# Patient Record
Sex: Female | Born: 1950 | Race: Black or African American | Hispanic: No | Marital: Married | Smoking: Never smoker
Health system: Southern US, Community
[De-identification: ages and names within clinical notes are randomized; demographics above are authoritative.]

## PROBLEM LIST (undated history)

## (undated) DIAGNOSIS — Z86010 Personal history of colonic polyps: Secondary | ICD-10-CM

## (undated) DIAGNOSIS — K219 Gastro-esophageal reflux disease without esophagitis: Secondary | ICD-10-CM

## (undated) DIAGNOSIS — E669 Obesity, unspecified: Secondary | ICD-10-CM

## (undated) DIAGNOSIS — E119 Type 2 diabetes mellitus without complications: Secondary | ICD-10-CM

## (undated) DIAGNOSIS — K922 Gastrointestinal hemorrhage, unspecified: Secondary | ICD-10-CM

## (undated) DIAGNOSIS — E785 Hyperlipidemia, unspecified: Secondary | ICD-10-CM

## (undated) DIAGNOSIS — I639 Cerebral infarction, unspecified: Secondary | ICD-10-CM

## (undated) DIAGNOSIS — I10 Essential (primary) hypertension: Secondary | ICD-10-CM

## (undated) HISTORY — DX: Hyperlipidemia, unspecified: E78.5

## (undated) HISTORY — DX: Personal history of colonic polyps: Z86.010

## (undated) HISTORY — PX: COLONOSCOPY: SHX174

## (undated) HISTORY — DX: Type 2 diabetes mellitus without complications: E11.9

## (undated) HISTORY — DX: Gastro-esophageal reflux disease without esophagitis: K21.9

## (undated) HISTORY — DX: Obesity, unspecified: E66.9

---

## 2005-05-30 DIAGNOSIS — I639 Cerebral infarction, unspecified: Secondary | ICD-10-CM

## 2005-05-30 HISTORY — DX: Cerebral infarction, unspecified: I63.9

## 2013-07-12 ENCOUNTER — Encounter (HOSPITAL_COMMUNITY): Payer: Self-pay | Admitting: Emergency Medicine

## 2013-07-12 ENCOUNTER — Emergency Department (HOSPITAL_COMMUNITY)
Admission: EM | Admit: 2013-07-12 | Discharge: 2013-07-13 | Disposition: A | Discharge status: Home / Self Care | Payer: No Typology Code available for payment source | Attending: Emergency Medicine | Admitting: Emergency Medicine

## 2013-07-12 ENCOUNTER — Emergency Department (HOSPITAL_COMMUNITY): Payer: No Typology Code available for payment source

## 2013-07-12 DIAGNOSIS — R5383 Other fatigue: Secondary | ICD-10-CM

## 2013-07-12 DIAGNOSIS — R5381 Other malaise: Secondary | ICD-10-CM | POA: Insufficient documentation

## 2013-07-12 DIAGNOSIS — J189 Pneumonia, unspecified organism: Secondary | ICD-10-CM

## 2013-07-12 DIAGNOSIS — R51 Headache: Secondary | ICD-10-CM | POA: Insufficient documentation

## 2013-07-12 DIAGNOSIS — I10 Essential (primary) hypertension: Secondary | ICD-10-CM | POA: Insufficient documentation

## 2013-07-12 DIAGNOSIS — J159 Unspecified bacterial pneumonia: Secondary | ICD-10-CM | POA: Insufficient documentation

## 2013-07-12 DIAGNOSIS — I69959 Hemiplegia and hemiparesis following unspecified cerebrovascular disease affecting unspecified side: Secondary | ICD-10-CM | POA: Insufficient documentation

## 2013-07-12 HISTORY — DX: Cerebral infarction, unspecified: I63.9

## 2013-07-12 HISTORY — DX: Essential (primary) hypertension: I10

## 2013-07-12 MED ORDER — ALBUTEROL SULFATE (2.5 MG/3ML) 0.083% IN NEBU
5.0000 mg | INHALATION_SOLUTION | Freq: Once | RESPIRATORY_TRACT | Status: AC
  Administered 2013-07-13: 5 mg via RESPIRATORY_TRACT
  Filled 2013-07-12: qty 6

## 2013-07-12 NOTE — ED Notes (Signed)
C/o sob and non-productive cough since yesterday.  Denies pain.

## 2013-07-13 LAB — BASIC METABOLIC PANEL
BUN: 11 mg/dL (ref 6–23)
CALCIUM: 9.4 mg/dL (ref 8.4–10.5)
CHLORIDE: 97 meq/L (ref 96–112)
CO2: 24 meq/L (ref 19–32)
Creatinine, Ser: 0.76 mg/dL (ref 0.50–1.10)
GFR calc Af Amer: >90 mL/min (ref >90)
GFR calc non Af Amer: 87 mL/min — ABNORMAL LOW (ref >90)
Glucose, Bld: 118 mg/dL — ABNORMAL HIGH (ref 70–99)
Potassium: 4 mEq/L (ref 3.7–5.3)
SODIUM: 136 meq/L — AB (ref 137–147)

## 2013-07-13 LAB — POCT I-STAT TROPONIN I: Troponin i, poc: 0.01 ng/mL (ref 0.00–0.08)

## 2013-07-13 LAB — CBC
HCT: 39 % (ref 36.0–46.0)
Hemoglobin: 12.7 g/dL (ref 12.0–15.0)
MCH: 28.3 pg (ref 26.0–34.0)
MCHC: 32.6 g/dL (ref 30.0–36.0)
MCV: 86.9 fL (ref 78.0–100.0)
PLATELETS: 243 10*3/uL (ref 150–400)
RBC: 4.49 MIL/uL (ref 3.87–5.11)
RDW: 13.9 % (ref 11.5–15.5)
WBC: 7.8 10*3/uL (ref 4.0–10.5)

## 2013-07-13 LAB — PRO B NATRIURETIC PEPTIDE: Pro B Natriuretic peptide (BNP): 15.1 pg/mL (ref 0–125)

## 2013-07-13 MED ORDER — KETOROLAC TROMETHAMINE 30 MG/ML IJ SOLN
30.0000 mg | Freq: Once | INTRAMUSCULAR | Status: AC
  Administered 2013-07-13: 30 mg via INTRAVENOUS
  Filled 2013-07-13: qty 1

## 2013-07-13 MED ORDER — LEVOFLOXACIN 500 MG PO TABS: 500.0000 mg | ORAL_TABLET | Freq: Every day | ORAL | Status: DC

## 2013-07-13 MED ORDER — METHYLPREDNISOLONE SODIUM SUCC 125 MG IJ SOLR
125.0000 mg | Freq: Once | INTRAMUSCULAR | Status: AC
  Administered 2013-07-13: 125 mg via INTRAVENOUS
  Filled 2013-07-13: qty 2

## 2013-07-13 MED ORDER — ALBUTEROL (5 MG/ML) CONTINUOUS INHALATION SOLN
10.0000 mg/h | INHALATION_SOLUTION | RESPIRATORY_TRACT | Status: AC
  Administered 2013-07-13: 10 mg/h via RESPIRATORY_TRACT
  Filled 2013-07-13: qty 20

## 2013-07-13 MED ORDER — ALBUTEROL SULFATE HFA 108 (90 BASE) MCG/ACT IN AERS: 1.0000 | INHALATION_SPRAY | Freq: Four times a day (QID) | RESPIRATORY_TRACT | Status: DC | PRN

## 2013-07-13 MED ORDER — LEVOFLOXACIN IN D5W 750 MG/150ML IV SOLN
750.0000 mg | Freq: Once | INTRAVENOUS | Status: AC
  Administered 2013-07-13: 750 mg via INTRAVENOUS
  Filled 2013-07-13: qty 150

## 2013-07-13 MED ORDER — PREDNISONE 50 MG PO TABS: 50.0000 mg | ORAL_TABLET | Freq: Every day | ORAL | Status: DC

## 2013-07-13 MED ORDER — METOCLOPRAMIDE HCL 5 MG/ML IJ SOLN
10.0000 mg | Freq: Once | INTRAMUSCULAR | Status: AC
  Administered 2013-07-13: 10 mg via INTRAVENOUS
  Filled 2013-07-13: qty 2

## 2013-07-13 NOTE — Discharge Instructions (Signed)

## 2013-07-13 NOTE — ED Provider Notes (Signed)
CSN: [id number]     Arrival date & time 07/12/13  2341 History   First MD Initiated Contact with Patient 07/13/13 0006     Chief Complaint  Patient presents with  . Shortness of Breath     (Consider location/radiation/quality/duration/timing/severity/associated sxs/prior Treatment) HPI Patient is a 63 year old female with a history of hypertension and stroke with residual left-sided weakness. She presents with one day of shortness of breath and nonproductive cough. She's had fatigue and frontal headache as well. Denies any neck pain or stiffness. Denies any chest pain or abdominal pain. She's had no nausea, vomiting or diarrhea. Denies any recent sick contacts. She was given a nebulized albuterol treatment prior to my evaluation states that her shortness of breath has improved. Denies any lower extremity swelling or pain. Past Medical History  Diagnosis Date  . Stroke   . Hypertension    History reviewed. No pertinent past surgical history. No family history on file. History  Substance Use Topics  . Smoking status: Never Smoker   . Smokeless tobacco: Not on file  . Alcohol Use: No   OB History   Grav Para Term Preterm Abortions TAB SAB Ect Mult Living                 Review of Systems  Constitutional: Positive for fatigue. Negative for fever and chills.  HENT: Positive for sinus pressure. Negative for sore throat.   Respiratory: Positive for cough and shortness of breath. Negative for wheezing.   Cardiovascular: Negative for chest pain, palpitations and leg swelling.  Gastrointestinal: Negative for nausea, vomiting, abdominal pain, diarrhea, constipation and blood in stool.  Genitourinary: Negative for dysuria and frequency.  Musculoskeletal: Negative for back pain, myalgias, neck pain and neck stiffness.  Skin: Negative for rash and wound.  Neurological: Positive for headaches. Negative for dizziness, weakness, light-headedness and numbness.  All other systems reviewed and  are negative.      Allergies  Review of patient's allergies indicates not on file.  Home Medications  No current outpatient prescriptions on file. BP 148/73  Pulse 90  Temp(Src) 100.4 F (38 C) (Oral)  Resp 32  Wt 172 lb (78.019 kg)  SpO2 98% Physical Exam  Nursing note and vitals reviewed. Constitutional: She is oriented to person, place, and time. She appears well-developed and well-nourished. No distress.  HENT:  Head: Normocephalic and atraumatic.  Mouth/Throat: Oropharynx is clear and moist. No oropharyngeal exudate.  Bilateral frontal and maxillary sinus tenderness to percussion.  Eyes: EOM are normal. Pupils are equal, round, and reactive to light.  Neck: Normal range of motion. Neck supple.  No meningismus.  Cardiovascular: Normal rate and regular rhythm.  Exam reveals no gallop and no friction rub.   No murmur heard. Pulmonary/Chest: Effort normal. No respiratory distress. She has no wheezes. She has no rales. She exhibits no tenderness.  Decreased air movement bilaterally.  Abdominal: Soft. Bowel sounds are normal. She exhibits no distension and no mass. There is no tenderness. There is no rebound and no guarding.  Musculoskeletal: Normal range of motion. She exhibits no edema and no tenderness.  No CVA tenderness bilaterally.  Neurological: She is alert and oriented to person, place, and time.  Patient is alert and oriented x3 with clear, goal oriented speech. Patient has 5/5 motor in right upper and lower extremity. 3/5 motor in left upper extremity and 4/5 motor in left lower extremity. Sensation is intact to light touch.   Skin: Skin is warm and dry. No rash  noted. No erythema.  Psychiatric: She has a normal mood and affect. Her behavior is normal.    ED Course  Procedures (including critical care time) Labs Review Labs Reviewed  CBC  BASIC METABOLIC PANEL  PRO B NATRIURETIC PEPTIDE   Imaging Review No results found.  EKG Interpretation     Date/Time:  Friday July 12 2013 23:52:15 EST Ventricular Rate:  94 PR Interval:  132 QRS Duration: 72 QT Interval:  350 QTC Calculation: 437 R Axis:   14 Text Interpretation:  Normal sinus rhythm Septal infarct , age undetermined Abnormal ECG Confirmed by Lita Mains  MD, Rosa Wyly ([id number]) on 07/13/2013 12:32:44 AM            MDM   Final diagnoses:  None   Patient is feeling much better after the nebulized treatments. She is ambulating in the halls without oxygen and maintaining saturations in the high 90s. Her tachycardia is resolved after albuterol treatment.  Given the fact the patient had a mild fever and questionable infiltrate on x-ray she was treated with IV antibiotics for kidney or pneumonia. We'll discharge home with antibiotics as well as short course of steroids and albuterol MDI. She's been given very strict cautions to followup with her primary Dr. and the next one to 2 days to assure resolution of her symptoms. She's been instructed to return immediately to the emergency department for any increasing shortness of breath, any chest pain, any fever or for any concerns. Both patient and her family members have voiced understanding.     Julianne Rice, MD 07/13/13 [phone]

## 2013-07-13 NOTE — ED Notes (Addendum)
Pt was 93% on RA while ambulating. States that she experienced no distress.

## 2013-08-27 ENCOUNTER — Encounter: Payer: Self-pay | Admitting: Cardiovascular Disease

## 2013-08-27 ENCOUNTER — Encounter: Payer: Self-pay | Admitting: *Deleted

## 2013-08-27 ENCOUNTER — Ambulatory Visit (INDEPENDENT_AMBULATORY_CARE_PROVIDER_SITE_OTHER): Payer: No Typology Code available for payment source | Admitting: Cardiovascular Disease

## 2013-08-27 VITALS — BP 146/72 | HR 74 | Ht 62.0 in | Wt 173.1 lb

## 2013-08-27 DIAGNOSIS — E119 Type 2 diabetes mellitus without complications: Secondary | ICD-10-CM | POA: Insufficient documentation

## 2013-08-27 DIAGNOSIS — E785 Hyperlipidemia, unspecified: Secondary | ICD-10-CM | POA: Insufficient documentation

## 2013-08-27 DIAGNOSIS — R079 Chest pain, unspecified: Secondary | ICD-10-CM

## 2013-08-27 DIAGNOSIS — I6350 Cerebral infarction due to unspecified occlusion or stenosis of unspecified cerebral artery: Secondary | ICD-10-CM

## 2013-08-27 DIAGNOSIS — I10 Essential (primary) hypertension: Secondary | ICD-10-CM

## 2013-08-27 DIAGNOSIS — R0609 Other forms of dyspnea: Secondary | ICD-10-CM

## 2013-08-27 DIAGNOSIS — E669 Obesity, unspecified: Secondary | ICD-10-CM | POA: Insufficient documentation

## 2013-08-27 DIAGNOSIS — R0600 Dyspnea, unspecified: Secondary | ICD-10-CM | POA: Insufficient documentation

## 2013-08-27 DIAGNOSIS — I639 Cerebral infarction, unspecified: Secondary | ICD-10-CM

## 2013-08-27 DIAGNOSIS — R9431 Abnormal electrocardiogram [ECG] [EKG]: Secondary | ICD-10-CM

## 2013-08-27 DIAGNOSIS — R0989 Other specified symptoms and signs involving the circulatory and respiratory systems: Secondary | ICD-10-CM

## 2013-08-27 NOTE — Progress Notes (Signed)
Patient ID: Erica Cooper, female   DOB: [birth year], 63 y.o.   MRN: [id number]    63 yo referred by Dr Charlane Ferretti for abnormal ECG  Patient arrived from [place] last year.  Son who is studying medicine arranged for parents to come over .  She has history of CVA over there Etiology not clear but has residual LU/LLE weakness.  Has HTN and elevated lipids on Rx  ECG from [facility] is incomplete and not interpretable ECG in epic 07/12/13 Shows SR with poor R wave progression and nonspecific ST.T wave changes.  She has noted increasing dyspnea and fatigue with ambulation Mild chronic LLE edema.  Non smoker no fever cough or sputum  Very infrequent sharp pain in chest that is positional and no anginal sounding.  Compliant with meds Denies history of MI.     ROS: Denies fever, malais, weight loss, blurry vision, decreased visual acuity, cough, sputum, SOB, hemoptysis, pleuritic pain, palpitaitons, heartburn, abdominal pain, melena, lower extremity edema, claudication, or rash.  All other systems reviewed and negative   General: Affect appropriate Healthy:  appears stated age 63: normal Neck supple with no adenopathy JVP normal no bruits no thyromegaly Lungs clear with no wheezing and good diaphragmatic motion Heart:  S1/S2 SEM  murmur,rub, gallop or click PMI normal Abdomen: benighn, BS positve, no tenderness, no AAA no bruit.  No HSM or HJR Distal pulses intact with no bruits No edema Neuro  LUE/LLE weakness  Skin warm and dry No muscular weakness  Medications Current Outpatient Prescriptions  Medication Sig Dispense Refill  . albuterol (PROVENTIL HFA;VENTOLIN HFA) 108 (90 BASE) MCG/ACT inhaler Inhale 1-2 puffs into the lungs every 6 (six) hours as needed for wheezing or shortness of breath.  1 Inhaler  0  . amLODipine (NORVASC) 5 MG tablet Take 5 mg by mouth daily.      Marland Kitchen atorvastatin (LIPITOR) 10 MG tablet Take 10 mg by mouth daily.      Marland Kitchen lisinopril (PRINIVIL,ZESTRIL) 10 MG tablet Take 10 mg  by mouth daily.      Marland Kitchen UNABLE TO FIND Take 1 tablet by mouth daily. Med Name: [place] 34 from [place]      . UNABLE TO FIND Take 1 tablet by mouth daily. Med Name: Neurobion from [place]       No current facility-administered medications for this visit.    Allergies Tramadol  Family History: Family History  Problem Relation Age of Onset  . Asthma    . Stroke      Social History: History   Social History  . Marital Status: Married    Spouse Name: N/A    Number of Children: N/A  . Years of Education: N/A   Occupational History  . Not on file.   Social History Main Topics  . Smoking status: Never Smoker   . Smokeless tobacco: Not on file  . Alcohol Use: No  . Drug Use: No  . Sexual Activity: Not on file   Other Topics Concern  . Not on file   Social History Narrative  . No narrative on file    Electrocardiogram:  SR rate 94 nonspecfic ST.T wave changes poor R wave progression   Assessment and Plan

## 2013-08-27 NOTE — Assessment & Plan Note (Signed)
Well controlled.  Continue current medications and low sodium Dash type diet.    

## 2013-08-27 NOTE — Assessment & Plan Note (Signed)
Discussed low carb diet.  Target hemoglobin A1c is 6.5 or less.  Continue current medications.  

## 2013-08-27 NOTE — Assessment & Plan Note (Signed)
Cholesterol is at goal.  Continue current dose of statin and diet Rx.  No myalgias or side effects.  F/U  LFT's in 6 months. No results found for this basename: LDLCALC             

## 2013-08-27 NOTE — Patient Instructions (Signed)
Your physician recommends that you schedule a follow-up appointment in: AS  NEEDED  Your physician recommends that you continue on your current medications as directed. Please refer to the Current Medication list given to you today.  Your physician has requested that you have a lexiscan myoview. For further information please visit [email or web address]. Please follow instruction sheet, as given. Your physician has requested that you have an echocardiogram. Echocardiography is a painless test that uses sound waves to create images of your heart. It provides your doctor with information about the size and shape of your heart and how well your heart's chambers and valves are working. This procedure takes approximately one hour. There are no restrictions for this procedure.  

## 2013-08-27 NOTE — Assessment & Plan Note (Signed)
?   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  Atypica pain and abnormal ECG Unable to walk on treadmill due to previous CVA F/U lexiscan myovue

## 2013-08-27 NOTE — Assessment & Plan Note (Signed)
F/U echo assess Rv/Lv function and assess murmur

## 2013-08-27 NOTE — Assessment & Plan Note (Signed)
Primary trying to arrange PT/OT  No swallowing difficulties or aspiration Speech ok  ASA

## 2013-09-11 ENCOUNTER — Ambulatory Visit (HOSPITAL_COMMUNITY): Payer: No Typology Code available for payment source | Attending: Internal Medicine | Admitting: Radiology

## 2013-09-11 ENCOUNTER — Ambulatory Visit (HOSPITAL_BASED_OUTPATIENT_CLINIC_OR_DEPARTMENT_OTHER): Payer: No Typology Code available for payment source | Admitting: Radiology

## 2013-09-11 VITALS — BP 126/53 | Ht 62.0 in | Wt 172.0 lb

## 2013-09-11 DIAGNOSIS — R9431 Abnormal electrocardiogram [ECG] [EKG]: Secondary | ICD-10-CM

## 2013-09-11 DIAGNOSIS — R0602 Shortness of breath: Secondary | ICD-10-CM

## 2013-09-11 DIAGNOSIS — R5381 Other malaise: Secondary | ICD-10-CM | POA: Insufficient documentation

## 2013-09-11 DIAGNOSIS — R0609 Other forms of dyspnea: Secondary | ICD-10-CM | POA: Insufficient documentation

## 2013-09-11 DIAGNOSIS — R079 Chest pain, unspecified: Secondary | ICD-10-CM

## 2013-09-11 DIAGNOSIS — R5383 Other fatigue: Secondary | ICD-10-CM

## 2013-09-11 DIAGNOSIS — R0989 Other specified symptoms and signs involving the circulatory and respiratory systems: Secondary | ICD-10-CM

## 2013-09-11 DIAGNOSIS — R0600 Dyspnea, unspecified: Secondary | ICD-10-CM

## 2013-09-11 MED ORDER — REGADENOSON 0.4 MG/5ML IV SOLN
0.4000 mg | Freq: Once | INTRAVENOUS | Status: AC
  Administered 2013-09-11: 0.4 mg via INTRAVENOUS

## 2013-09-11 MED ORDER — AMINOPHYLLINE 25 MG/ML IV SOLN
75.0000 mg | Freq: Once | INTRAVENOUS | Status: AC
  Administered 2013-09-11: 75 mg via INTRAVENOUS

## 2013-09-11 MED ORDER — TECHNETIUM TC 99M SESTAMIBI GENERIC - CARDIOLITE
11.0000 | Freq: Once | INTRAVENOUS | Status: AC | PRN
  Administered 2013-09-11: 11 via INTRAVENOUS

## 2013-09-11 MED ORDER — TECHNETIUM TC 99M SESTAMIBI GENERIC - CARDIOLITE
33.0000 | Freq: Once | INTRAVENOUS | Status: AC | PRN
Start: 2013-09-11 — End: 2013-09-11
  Administered 2013-09-11: 33 via INTRAVENOUS

## 2013-09-11 NOTE — Progress Notes (Signed)
Echocardiogram Performed. 

## 2013-09-11 NOTE — Progress Notes (Signed)
[facility] [address] [place], [place] [address] [phone]    Cardiology Nuclear Med Study  Erica Cooper is a 63 y.o. female     MRN : [id number]     DOB: 1951/02/18  Procedure Date: 09/11/2013  Nuclear Med Background Indication for Stress Test:  Evaluation for Ischemia and Abnormal EKG History:  Asthma and 2005/09/11/15 ECHO: Pending Cardiac Risk Factors: CVA with (L) sided weakness, Hypertension and Lipids  Symptoms:  DOE and Fatigue   Nuclear Pre-Procedure Caffeine/Decaff Intake:  None > 12hrs NPO After: 8:00pm   Lungs:  clear O2 Sat: 95% on room air. IV 0.9% NS with Angio Cath:  22g  IV Site: R Antecubital x 1, tolerated well IV Started by:  Irven Baltimore, RN  Chest Size (in):  40 Cup Size: B  Height: 5\' 2"  (1.575 m)  Weight:  172 lb (78.019 kg)  BMI:  Body mass index is 31.45 kg/(m^2). Tech Comments:  Patient speaks [place] and [place]. She was able to understand my questions, son was also present. Irven Baltimore, RN  Aminophylline 75 mg IV was given for symptoms. All symptoms were resolved after the Rx was given.    Nuclear Med Study 1 or 2 day study: 1 day  Stress Test Type:  Carlton Adam  Reading MD: N/A  Order Authorizing Provider:  Jenkins Rouge, MD  Resting Radionuclide: Technetium 74m Sestamibi  Resting Radionuclide Dose: 11.0 mCi   Stress Radionuclide:  Technetium 4m Sestamibi  Stress Radionuclide Dose: 33.0 mCi           Stress Protocol Rest HR: 67 Stress HR: 106  Rest BP: 126/53 Stress BP: 154/103  Exercise Time (min): n/a METS: n/a   Predicted Max HR: 156 bpm % Max HR: 67.95 bpm Rate Pressure Product: 16324   Dose of Adenosine (mg):  n/a Dose of Lexiscan: 0.4 mg  Dose of Atropine (mg): n/a Dose of Dobutamine: n/a mcg/kg/min (at max HR)  Stress Test Technologist: Perrin Maltese, EMT-P  Nuclear Technologist:  Charlton Amor, CNMT     Rest Procedure:  Myocardial perfusion imaging was performed at rest 45 minutes  following the intravenous administration of Technetium 53m Sestamibi. Rest ECG: NSR with non-specific ST-T wave changes  Stress Procedure:  The patient received IV Lexiscan 0.4 mg over 15-seconds.  Technetium 58m Sestamibi injected at 30-seconds. This patient had sob, headache,nausea, and abdominal pain with the Lexiscan injection. Quantitative spect images were obtained after a 45 minute delay. Stress ECG: No significant change from baseline ECG  QPS Raw Data Images:  Normal; no motion artifact; normal heart/lung ratio. Stress Images:  Normal homogeneous uptake in all areas of the myocardium. Rest Images:  Normal homogeneous uptake in all areas of the myocardium. Subtraction (SDS):  No evidence of ischemia. Transient Ischemic Dilatation (Normal <1.22):  1,02 Lung/Heart Ratio (Normal <0.45):  0.35  Quantitative Gated Spect Images QGS EDV:  67 ml QGS ESV:  14 ml  Impression Exercise Capacity:  Lexiscan with no exercise. BP Response:  Normal blood pressure response. Clinical Symptoms:  No chest pain. ECG Impression:  No significant ST segment change suggestive of ischemia. Comparison with Prior Nuclear Study: No images to compare  Overall Impression:  Normal stress nuclear study.  LV Ejection Fraction: 79%.  LV Wall Motion:  NL LV Function; NL Wall Motion  Darlin Coco  MD

## 2014-07-31 ENCOUNTER — Encounter: Payer: Self-pay | Admitting: Gastroenterology

## 2014-09-09 ENCOUNTER — Ambulatory Visit: Payer: 59 | Attending: Internal Medicine

## 2014-09-09 DIAGNOSIS — R269 Unspecified abnormalities of gait and mobility: Secondary | ICD-10-CM | POA: Insufficient documentation

## 2014-09-09 DIAGNOSIS — R531 Weakness: Secondary | ICD-10-CM

## 2014-09-09 DIAGNOSIS — M6289 Other specified disorders of muscle: Secondary | ICD-10-CM | POA: Insufficient documentation

## 2014-09-09 NOTE — Therapy (Signed)
[facility] [address] [place], [place], [address] Phone: [phone]   Fax:  [phone]  Physical Therapy Evaluation  Patient Details  Name: Erica Cooper MRN: [id number] Date of Birth: [birth year] Referring Provider:  Nolene Ebbs, MD  Encounter Date: 09/09/2014      PT End of Session - 09/09/14 1524    Visit Number 1   Number of Visits 17   Date for PT Re-Evaluation 11/08/14   Authorization Type G-code every 10th visit.   PT Start Time 1147   PT Stop Time 1235   PT Time Calculation (min) 48 min   Equipment Utilized During Treatment Gait belt   Activity Tolerance Patient tolerated treatment well   Behavior During Therapy WFL for tasks assessed/performed      Past Medical History  Diagnosis Date  . Stroke   . Hypertension   . Diabetes   . Obesity   . Hyperlipidemia     History reviewed. No pertinent past surgical history.  There were no vitals filed for this visit.  Visit Diagnosis:  Abnormality of gait - Plan: PT plan of care cert/re-cert  Left-sided weakness - Plan: PT plan of care cert/re-cert      Subjective Assessment - 09/09/14 1201    Subjective Difficulty walking, impaired balance, decrease L sided strength   Patient is accompained by: Family member   Pertinent History CVA in 2007, Diabetes per medical chart, HTN   Patient Stated Goals Walk better and use L arm to cook and dress herself   Currently in Pain? No/denies            [facility] PT Assessment - 09/09/14 1202    Assessment   Medical Diagnosis CVA   Onset Date 01/02/06   Prior Therapy PT in [place] after CVA   Precautions   Precautions Fall   Precaution Comments per BERG score   Restrictions   Weight Bearing Restrictions No   Balance Screen   Has the patient fallen in the past 6 months No   Has the patient had a decrease in activity level because of a fear of falling?  No   Is the patient reluctant to leave their home  because of a fear of falling?  No   Home Environment   Living Enviornment Private residence   Living Arrangements Spouse/significant other;Children   Available Help at Discharge Family   Type of [place] to enter   Entrance Stairs-Number of Steps 3   Entrance Stairs-Rails Can reach both   [place] One level   [place] - single point   Prior Function   Level of Independence Independent with basic ADLs;Independent with homemaking with ambulation;Independent with gait;Independent with transfers   Vocation Retired   Leisure Play with her grandchildren, cooking   Cognition   Overall Cognitive Status Within Functional Limits for tasks assessed   Observation/Other Assessments   Focus on Therapeutic Outcomes (FOTO)  Physical fear FOTO measure: 49   Sensation   Light Touch Appears Intact   Additional Comments No c/o N/T   Coordination   Gross Motor Movements are Fluid and Coordinated No  L UE/LE impaired   Fine Motor Movements are Fluid and Coordinated No  L UE    Posture/Postural Control   Posture/Postural Control No significant limitations   ROM / Strength   AROM / PROM / Strength AROM;Strength   AROM   Overall AROM  Deficits   Overall AROM Comments  L UE supination impaired and shoulder flexion limited to approx. 60 degrees. R UE/LE WFL. L ankle dorsiflexion limited to neutral.   Strength   Overall Strength Deficits   Overall Strength Comments R UE/LE WFL. L LE hip flexion: 3/5, L knee ext: 4/5, L knee flex: 3+/5, L ankle dorsiflexion: 2/5.   Transfers   Transfers Sit to Stand;Stand to Sit   Sit to Stand 5: Supervision;With upper extremity assist;With armrests;From chair/3-in-1   Stand to Sit 5: Supervision;With upper extremity assist;With armrests;To chair/3-in-1   Ambulation/Gait   Ambulation/Gait Yes   Ambulation/Gait Assistance 5: Supervision   Ambulation/Gait Assistance Details No overt LOB episodes noted.   Ambulation Distance (Feet)  100 Feet   Assistive device None   Gait Pattern Step-through pattern;Decreased dorsiflexion - left;Decreased hip/knee flexion - left;Decreased stride length;Left circumduction;Wide base of support;Decreased trunk rotation   Ambulation Surface Level;Indoor   Gait velocity 1.69ft/sec.  no AD   Standardized Balance Assessment   Standardized Balance Assessment Berg Balance Test;Timed Up and Go Test   Berg Balance Test   Sit to Stand Able to stand without using hands and stabilize independently   Standing Unsupported Able to stand safely 2 minutes   Sitting with Back Unsupported but Feet Supported on Floor or Stool Able to sit safely and securely 2 minutes   Stand to Sit Sits safely with minimal use of hands   Transfers Able to transfer safely, minor use of hands   Standing Unsupported with Eyes Closed Able to stand 10 seconds with supervision   Standing Ubsupported with Feet Together Needs help to attain position but able to stand for 30 seconds with feet together   From Standing, Reach Forward with Outstretched Arm Can reach forward >12 cm safely (5")   From Standing Position, Pick up Object from Floor Able to pick up shoe, needs supervision   From Standing Position, Turn to Look Behind Over each Shoulder Looks behind one side only/other side shows less weight shift   Turn 360 Degrees Able to turn 360 degrees safely but slowly   Standing Unsupported, Alternately Place Feet on Step/Stool Able to complete >2 steps/needs minimal assist   Standing Unsupported, One Foot in Front Able to plae foot ahead of the other independently and hold 30 seconds   Standing on One Leg Tries to lift leg/unable to hold 3 seconds but remains standing independently   Total Score 40   Timed Up and Go Test   TUG Normal TUG   Normal TUG (seconds) 18.42  without AD                           PT Education - 09/09/14 1523    Education provided Yes   Education Details PT frequency and duration. PT  also encouraged pt to use [facility] for improved balance and safety during amb.   Person(s) Educated Patient;Spouse;Child(ren)   Methods Explanation   Comprehension Verbalized understanding          PT Short Term Goals - 09/09/14 1638    PT SHORT TERM GOAL #1   Title Pt will be independent in HEP to improve strength, balance, and safety during functional mobility. Target date: 10/07/14.   Status New   PT SHORT TERM GOAL #2   Title Pt will improve BERG score to >/=44/56 to decrease falls risk. Target date: 10/07/14.   Status New   PT SHORT TERM GOAL #3   Title Pt will ambulate 300' over  even terrain with LRAD at MOD I level to improve functional mobilty. Target date: 10/07/14.   Status New   PT SHORT TERM GOAL #4   Title Pt will perform TUG with LRAD in </=13.5 seconds to decrease falls risk. Target date: 10/07/14.   Status New           PT Long Term Goals - 10-02-14 1640    PT LONG TERM GOAL #1   Title Pt will verbalize understanding of CVA symptoms and risk factors to reduce risk of future CVA. Target date: 11/04/14.   Status New   PT LONG TERM GOAL #2   Title Pt will ambulate 600' over even/uneven terrain with LRAD at MOD I level to improve functional mobility and to play with her grandchildren. Target dat: 11/04/14.   Status New   PT LONG TERM GOAL #3   Title Pt will improve BERG score to >/=48/56 to decrease falls risk. Target date: 11/04/14.   Status New   PT LONG TERM GOAL #4   Title Pt will improve her gait speed with LRAD to >/=1.70ft/sec. to reduce falls risk. Target date: 11/04/14.   Status New   PT LONG TERM GOAL #5   Title Pt will improve FOTO score by 10 points to improve quality of life. Target date: 11/04/14.   Status New   Additional Long Term Goals   Additional Long Term Goals Yes   PT LONG TERM GOAL #6   Title Pt will be able to peform reaching outside BOS activities (5 inches) while standing with feet apart for 5 minutes,without LOB, at MOD I level in order to cook at  home. Target date: 11/04/14.   Status New               Plan - 10-02-2014 1158    Clinical Impression Statement Pt is a pleasant 63y/o female presenting OPPT neuro with history of CVA in 2007. Pt's son Bretta Bang) and husband Ezzard Flax?) present during session. Pt understands English, but requires time to process as it is her second language. Pt received therapy in [place] (where she is from originally), but has since noticed a decline in strength, endurance, and balance. Pt arrived to [place] in 2014 and has not received any PT during her time in the [place]  Pt's BERG score of 40/56 and TUG time of 18.42sec. indicate pt is at a risk for falls. Pt also demonstrated decreased L LE strength and poor endurance.    Pt will benefit from skilled therapeutic intervention in order to improve on the following deficits Abnormal gait;Decreased endurance;Decreased knowledge of use of DME;Decreased balance;Decreased mobility;Decreased strength;Impaired UE functional use;Decreased range of motion;Other (comment)  orthotic training and fitting   Rehab Potential Fair   Clinical Impairments Affecting Rehab Potential CVA was 8.5 years ago   PT Frequency 2x / week   PT Duration 8 weeks   PT Treatment/Interventions ADLs/Self Care Home Management;Gait training;Neuromuscular re-education;Stair training;Biofeedback;Functional mobility training;Patient/family education;Therapeutic activities;Electrical Stimulation;Therapeutic exercise;Manual techniques;DME Instruction;Balance training;Other (comment)  orthotic fitting and training   PT Next Visit Plan Initiate balance and strengthening HEP   Consulted and Agree with Plan of Care Patient;Family member/caregiver   Family Member Consulted pt's son-Taye and pt's husband-[name]?          G-Codes - Oct 02, 2014 1655    Functional Assessment Tool Used BERG: 40/56; TUG no AD: 18.42sec.; gait speed no AD: 1.52ft/sec.   Functional Limitation Mobility: Walking and moving around    Mobility: Walking and Moving Around Current Status (  [id number]) At least 40 percent but less than 60 percent impaired, limited or restricted   Mobility: Walking and Moving Around Goal Status [phone]) At least 20 percent but less than 40 percent impaired, limited or restricted       Problem List Patient Active Problem List   Diagnosis Date Noted  . Dyspnea 08/27/2013  . Abnormal ECG 08/27/2013  . Stroke   . Hypertension   . Diabetes   . Obesity   . Hyperlipidemia     Ahnesti Townsend L 09/09/2014, 4:56 PM  [facility] [address] [place], [place], [address] Phone: [phone]   Fax:  [phone]    Geoffry Paradise, PT,DPT 09/09/2014 4:56 PM Phone: [phone] Fax: [phone]

## 2014-09-16 ENCOUNTER — Ambulatory Visit: Payer: 59

## 2014-09-16 DIAGNOSIS — R269 Unspecified abnormalities of gait and mobility: Secondary | ICD-10-CM | POA: Diagnosis not present

## 2014-09-16 DIAGNOSIS — R531 Weakness: Secondary | ICD-10-CM

## 2014-09-16 NOTE — Therapy (Signed)
[facility] [address] [place], [place], [address] Phone: [phone]   Fax:  [phone]  Physical Therapy Treatment  Patient Details  Name: Erica Cooper MRN: [id number] Date of Birth: 1950-12-29 Referring Provider:  Tillman Sers, MD  Encounter Date: 09/16/2014      PT End of Session - 09/16/14 2129    Visit Number 2   Number of Visits 17   Date for PT Re-Evaluation 11/08/14   Authorization Type G-code every 10th visit.   PT Start Time [phone]   PT Stop Time 1013   PT Time Calculation (min) 40 min   Equipment Utilized During Treatment Gait belt   Activity Tolerance Patient tolerated treatment well   Behavior During Therapy WFL for tasks assessed/performed      Past Medical History  Diagnosis Date  . Stroke   . Hypertension   . Diabetes   . Obesity   . Hyperlipidemia     History reviewed. No pertinent past surgical history.  There were no vitals filed for this visit.  Visit Diagnosis:  Left-sided weakness      Subjective Assessment - 09/16/14 0936    Subjective Pt denied falls or changes since last visit.   Patient is accompained by: Family member   Pertinent History CVA in 2007, Diabetes per medical chart, HTN   Patient Stated Goals Walk better and use Cooper arm to cook and dress herself   Currently in Pain? No/denies      Therex: VC's and demonstration for technique. -Seated Cooper ankle dorsiflexion. -Standing B heel/toe raises x20, difficulty with Cooper toe raises. -Standing B hip flexion x10/LE. -Standing Cooper hip abd and ext. x5. Increased postural sway and trunk flexion. -Supine Cooper heel slides x10.  -Hooklying B hip abd x10. -Cooper clamshells-pt unable to lift Cooper knee against gravity. -Hooklying marches x5/LE.                           PT Education - 09/16/14 2129    Education provided Yes   Education Details Strengthening HEP   Person(s) Educated Patient;Spouse   Methods  Explanation;Demonstration;Tactile cues;Verbal cues;Handout   Comprehension Verbalized understanding;Returned demonstration          PT Short Term Goals - 09/16/14 2132    PT SHORT TERM GOAL #1   Title Pt will be independent in HEP to improve strength, balance, and safety during functional mobility. Target date: 10/07/14.   Status On-going   PT SHORT TERM GOAL #2   Title Pt will improve BERG score to >/=44/56 to decrease falls risk. Target date: 10/07/14.   Status On-going   PT SHORT TERM GOAL #3   Title Pt will ambulate 300' over even terrain with LRAD at MOD I level to improve functional mobilty. Target date: 10/07/14.   Status On-going   PT SHORT TERM GOAL #4   Title Pt will perform TUG with LRAD in </=13.5 seconds to decrease falls risk. Target date: 10/07/14.   Status On-going           PT Long Term Goals - 09/16/14 2133    PT LONG TERM GOAL #1   Title Pt will verbalize understanding of CVA symptoms and risk factors to reduce risk of future CVA. Target date: 11/04/14.   Status On-going   PT LONG TERM GOAL #2   Title Pt will ambulate 600' over even/uneven terrain with LRAD at MOD I level to improve functional mobility and to  play with her grandchildren. Target dat: 11/04/14.   Status On-going   PT LONG TERM GOAL #3   Title Pt will improve BERG score to >/=48/56 to decrease falls risk. Target date: 11/04/14.   Status On-going   PT LONG TERM GOAL #4   Title Pt will improve her gait speed with LRAD to >/=1.74ft/sec. to reduce falls risk. Target date: 11/04/14.   Status On-going   PT LONG TERM GOAL #5   Title Pt will improve FOTO score by 10 points to improve quality of life. Target date: 11/04/14.   Status On-going   PT LONG TERM GOAL #6   Title Pt will be able to peform reaching outside BOS activities (5 inches) while standing with feet apart for 5 minutes,without LOB, at MOD I level in order to cook at home. Target date: 11/04/14.   Status On-going               Plan -  09/16/14 2130    Clinical Impression Statement Pt continues to be limited by Cooper LE weakness as she required rest breaks during session. Pt would continue to benefit from skilled PT to improve safety during functional mobility.   Pt will benefit from skilled therapeutic intervention in order to improve on the following deficits Abnormal gait;Decreased endurance;Decreased knowledge of use of DME;Decreased balance;Decreased mobility;Decreased strength;Impaired UE functional use;Decreased range of motion;Other (comment)  orthotic training and fitting   Rehab Potential Fair   Clinical Impairments Affecting Rehab Potential CVA was 8.5 years ago   PT Frequency 2x / week   PT Duration 8 weeks   PT Treatment/Interventions ADLs/Self Care Home Management;Gait training;Neuromuscular re-education;Stair training;Biofeedback;Functional mobility training;Patient/family education;Therapeutic activities;Electrical Stimulation;Therapeutic exercise;Manual techniques;DME Instruction;Balance training;Other (comment)  orthotic training/fitting   PT Next Visit Plan balance HEP and CVA ed   Consulted and Agree with Plan of Care Patient;Family member/caregiver   Family Member Consulted pt's husband-[name]?        Problem List Patient Active Problem List   Diagnosis Date Noted  . Dyspnea 08/27/2013  . Abnormal ECG 08/27/2013  . Stroke   . Hypertension   . Diabetes   . Obesity   . Hyperlipidemia     Erica Cooper,Erica Cooper 09/16/2014, 9:33 PM  [facility] [address] [place], [place], [address] Phone: [phone]   Fax:  [phone]    Erica Cooper, PT,DPT 09/16/2014 9:33 PM Phone: [phone] Fax: [phone]

## 2014-09-16 NOTE — Patient Instructions (Signed)
Functional Quadriceps: Sit to Stand   Sit on edge of chair, feet flat on floor. Stand upright, extending knees fully. Repeat _10___ times per set. Do __2__ sets per session. Do __1__ sessions per day.  http://orth.exer.us/735   Copyright  VHI. All rights reserved.    Heel Slide   Bend Left knee and pull heel toward buttocks. Hold __2__ seconds. Return.  Repeat __10__ times. Do __1__ sessions per day.  http://[email or web address].exer.us/372   Copyright  VHI. All rights reserved.    Hip Abduction / Adduction: with Knee Flexion (Supine)   Bend both knees, then gently lower both knees to side and return. Repeat __10__ times per set. Do __3__ sets per session. Do __4__ sessions per week.  http://orth.exer.us/683   Copyright  VHI. All rights reserved.    Toe / Heel Raise (Standing)   Standing with support, raise heels, then return to starting position. Repeat __20__ times. Perform 4 times per week.  Copyright  VHI. All rights reserved.    ANKLE: Dorsiflexion (Band)   Sit at edge of surface.  Keeping heel on floor, raise toes of foot. Hold _2__ seconds. Place band around top of foot when this exercise becomes easy. _10__ reps per set, _3__ sets per day, _4__ days per week  Copyright  VHI. All rights reserved.    "I love a Press photographer. march in place with both legs. Repeat _10___ times  Per leg. Do __1__ sessions per day.  http://gt2.exer.us/345   Copyright  VHI. All rights reserved.  Bridge Pose, One Leg   Bring Right knee to chest. Roll up from tailbone to bridge pose on Left  leg. Focus on engaging posterior hip muscles. Hold for _2___ breaths. Repeat __10__ times.  Copyright  VHI. All rights reserved.

## 2014-09-19 ENCOUNTER — Ambulatory Visit: Payer: 59

## 2014-09-19 DIAGNOSIS — R269 Unspecified abnormalities of gait and mobility: Secondary | ICD-10-CM

## 2014-09-19 DIAGNOSIS — R531 Weakness: Secondary | ICD-10-CM

## 2014-09-19 NOTE — Patient Instructions (Addendum)
Ischemic Stroke A stroke (cerebrovascular accident) is the sudden death of brain tissue. It is a medical emergency. A stroke can cause permanent loss of brain function. This can cause problems with different parts of your body. A transient ischemic attack (TIA) is different because it does not cause permanent damage. A TIA is a short-lived problem of poor blood flow affecting a part of the brain. A TIA is also a serious problem because having a TIA greatly increases the chances of having a stroke. When symptoms first develop, you cannot know if the problem might be a stroke or a TIA. CAUSES  A stroke is caused by a decrease of oxygen supply to an area of your brain. It is usually the result of a small blood clot or collection of cholesterol or fat (plaque) that blocks blood flow in the brain. A stroke can also be caused by blocked or damaged carotid arteries.  RISK FACTORS  High blood pressure (hypertension).  High cholesterol.  Diabetes mellitus.  Heart disease.  The buildup of plaque in the blood vessels (peripheral artery disease or atherosclerosis).  The buildup of plaque in the blood vessels providing blood and oxygen to the brain (carotid artery stenosis).  An abnormal heart rhythm (atrial fibrillation).  Obesity.  Smoking.  Taking oral contraceptives (especially in combination with smoking).  Physical inactivity.  A diet high in fats, salt (sodium), and calories.  Alcohol use.  Use of illegal drugs (especially cocaine and methamphetamine).  Being African American.  Being over the age of 39.  Family history of stroke.  Previous history of blood clots, stroke, TIA, or heart attack.  Sickle cell disease. SYMPTOMS  These symptoms usually develop suddenly, or may be newly present upon awakening from sleep:  Sudden weakness or numbness of the face, arm, or leg, especially on one side of the body.  Sudden trouble walking or difficulty moving arms or legs.  Sudden  confusion.  Sudden personality changes.  Trouble speaking (aphasia) or understanding.  Difficulty swallowing.  Sudden trouble seeing in one or both eyes.  Double vision.  Dizziness.  Loss of balance or coordination.  Sudden severe headache with no known cause.  Trouble reading or writing. DIAGNOSIS  Your health care provider can often determine the presence or absence of a stroke based on your symptoms, history, and physical exam. Computed tomography (CT) of the brain is usually performed to confirm the stroke, determine causes, and determine stroke severity. Other tests may be done to find the cause of the stroke. These tests may include:  Electrocardiography.  Continuous heart monitoring.  Echocardiography.  Carotid ultrasonography.  Magnetic resonance imaging (MRI).  A scan of the brain circulation.  Blood tests. PREVENTION  The risk of a stroke can be decreased by appropriately treating high blood pressure, high cholesterol, diabetes, heart disease, and obesity and by quitting smoking, limiting alcohol, and staying physically active. TREATMENT  Time is of the essence. It is important to seek treatment at the first sign of these symptoms because you may receive a medicine to dissolve the clot (thrombolytic) that cannot be given if too much time has passed since your symptoms began. Even if you do not know when your symptoms began, get treatment as soon as possible as there are other treatment options available including oxygen, intravenous (IV) fluids, and medicines to thin the blood (anticoagulants). Treatment of stroke depends on the duration, severity, and cause of your symptoms. Medicines and dietary changes may be used to address diabetes, high blood  pressure, and other risk factors. Physical, speech, and occupational therapists will assess you and work with you to improve any functions impaired by the stroke. Measures will be taken to prevent short-term and long-term  complications, including infection from breathing foreign material into the lungs (aspiration pneumonia), blood clots in the legs, bedsores, and falls. Rarely, surgery may be needed to remove large blood clots or to open up blocked arteries. HOME CARE INSTRUCTIONS   Take medicines only as directed by your health care provider. Follow the directions carefully. Medicines may be used to control risk factors for a stroke. Be sure you understand all your medicine instructions.  You may be told to take a medicine to thin the blood, such as aspirin or the anticoagulant warfarin. Warfarin needs to be taken exactly as instructed.  Too much and too little warfarin are both dangerous. Too much warfarin increases the risk of bleeding. Too little warfarin continues to allow the risk for blood clots. While taking warfarin, you will need to have regular blood tests to measure your blood clotting time. These blood tests usually include both the PT and INR tests. The PT and INR results allow your health care provider to adjust your dose of warfarin. The dose can change for many reasons. It is critically important that you take warfarin exactly as prescribed, and that you have your PT and INR levels drawn exactly as directed.  Many foods, especially foods high in vitamin K, can interfere with warfarin and affect the PT and INR results. Foods high in vitamin K include spinach, kale, broccoli, cabbage, collard and turnip greens, brussels sprouts, peas, cauliflower, seaweed, and parsley, as well as beef and pork liver, green tea, and soybean oil. You should eat a consistent amount of foods high in vitamin K. Avoid major changes in your diet, or notify your health care provider before changing your diet. Arrange a visit with a dietitian to answer your questions.  Many medicines can interfere with warfarin and affect the PT and INR results. You must tell your health care provider about any and all medicines you take. This  includes all vitamins and supplements. Be especially cautious with aspirin and anti-inflammatory medicines. Do not take or discontinue any prescribed or over-the-counter medicine except on the advice of your health care provider or pharmacist.  Warfarin can have side effects, such as excessive bruising or bleeding. You will need to hold pressure over cuts for longer than usual. Your health care provider or pharmacist will discuss other potential side effects.  Avoid sports or activities that may cause injury or bleeding.  Be mindful when shaving, flossing your teeth, or handling sharp objects.  Alcohol can change the body's ability to handle warfarin. It is best to avoid alcoholic drinks or consume only very small amounts while taking warfarin. Notify your health care provider if you change your alcohol intake.  Notify your dentist or other health care providers before procedures.  If swallow studies have determined that your swallowing reflex is present, you should eat healthy foods. Including 5 or more servings of fruits and vegetables a day may reduce the risk of stroke. Foods may need to be a certain consistency (soft or pureed), or small bites may need to be taken in order to avoid aspirating or choking. Certain dietary changes may be advised to address high blood pressure, high cholesterol, diabetes, or obesity.  Food choices that are low in sodium, saturated fat, trans fat, and cholesterol are recommended to manage high blood pressure.  Food choies that are high in fiber, and low in saturated fat, trans fat, and cholesterol may control cholesterol levels.  Controlling carbohydrates and sugar intake is recommended to manage diabetes.  Reducing calorie intake and making food choices that are low in sodium, saturated fat, trans fat, and cholesterol are recommended to manage obesity.  Maintain a healthy weight.  Stay physically active. It is recommended that you get at least 30 minutes of  activity on all or most days.  Do not use any tobacco products including cigarettes, chewing tobacco, or electronic cigarettes.  Limit alcohol use even if you are not taking warfarin. Moderate alcohol use is considered to be:  No more than 2 drinks each day for men.  No more than 1 drink each day for nonpregnant women.  Home safety. A safe home environment is important to reduce the risk of falls. Your health care provider may arrange for specialists to evaluate your home. Having grab bars in the bedroom and bathroom is often important. Your health care provider may arrange for equipment to be used at home, such as raised toilets and a seat for the shower.  Physical, occupational, and speech therapy. Ongoing therapy may be needed to maximize your recovery after a stroke. If you have been advised to use a walker or a cane, use it at all times. Be sure to keep your therapy appointments.  Follow all instructions for follow-up with your health care provider. This is very important. This includes any referrals, physical therapy, rehabilitation, and lab tests. Proper follow-up can prevent another stroke from occurring. SEEK MEDICAL CARE IF:  You have personality changes.  You have difficulty swallowing.  You are seeing double.  You have dizziness.  You have a fever.  You have skin breakdown. SEEK IMMEDIATE MEDICAL CARE IF:  Any of these symptoms may represent a serious problem that is an emergency. Do not wait to see if the symptoms will go away. Get medical help right away. Call your local emergency services (911 in [place] Do not drive yourself to the hospital.  You have sudden weakness or numbness of the face, arm, or leg, especially on one side of the body.  You have sudden trouble walking or difficulty moving arms or legs.  You have sudden confusion.  You have trouble speaking (aphasia) or understanding.  You have sudden trouble seeing in one or both eyes.  You have a loss of  balance or coordination.  You have a sudden, severe headache with no known cause.  You have new chest pain or an irregular heartbeat.  You have a partial or total loss of consciousness. Document Released: 05/16/2005 Document Revised: 09/30/2013 Document Reviewed: 12/25/2011 [facility] Patient Information 2015 [place], [place]. This information is not intended to replace advice given to you by your health care provider. Make sure you discuss any questions you have with your health care provider.                                           Feet Apart (Compliant Surface) Head Motion - Eyes Closed   Stand on compliant surface: ____pillow____ with feet shoulder width apart. Close eyes and move head slowly, up and down and side to side for 30 seconds. Repeat __3__ times per session. Do __1__ sessions per day.  Copyright  VHI. All rights reserved.  Feet Together, Head Motion - Eyes Open  With eyes open, feet together, move head slowly: up and down and side to side for 30 seconds. Repeat __3__ times per session. Do __1__ sessions per day.  Copyright  VHI. All rights reserved.  Feet Together, Varied Arm Positions - Eyes Closed   Stand with feet together and arms at your side. Close eyes and visualize upright position. Hold _10-30___ seconds. Repeat __3__ times per session. Do __1__ sessions per day.  Copyright  VHI. All rights reserved.  Feet Partial Heel-Toe, Varied Arm Positions - Eyes Open   With eyes open, right foot partially in front of the other, arms at your side, look straight ahead at a stationary object. Hold __30__ seconds. Repeat with left foot in front of the other. Repeat __3__ times per session. Do __1__ sessions per day.  Copyright  VHI. All rights reserved.  Single Leg - Eyes Open   Holding support, lift right leg while maintaining balance over other leg. Progress to removing hands from support surface for longer periods of  time. Hold__10-30__ seconds. Repeat __3__ times per session. Do __1__ sessions per day.  Copyright  VHI. All rights reserved.

## 2014-09-19 NOTE — Therapy (Signed)
[facility] [address] [place], [place], [address] Phone: [phone]   Fax:  [phone]  Physical Therapy Treatment  Patient Details  Name: Erica Cooper MRN: [id number] Date of Birth: 1950/09/13 Referring Provider:  Tillman Sers, MD  Encounter Date: 09/19/2014      PT End of Session - 09/19/14 1220    Visit Number 3   Number of Visits 17   Date for PT Re-Evaluation 11/08/14   Authorization Type G-code every 10th visit.   PT Start Time 0848   PT Stop Time 0930   PT Time Calculation (min) 42 min   Equipment Utilized During Treatment Gait belt   Activity Tolerance Patient tolerated treatment well   Behavior During Therapy WFL for tasks assessed/performed      Past Medical History  Diagnosis Date  . Stroke   . Hypertension   . Diabetes   . Obesity   . Hyperlipidemia     History reviewed. No pertinent past surgical history.  There were no vitals filed for this visit.  Visit Diagnosis:  Abnormality of gait  Left-sided weakness      Subjective Assessment - 09/19/14 0851    Subjective Pt denied falls or changes since last visit.   Patient is accompained by: Family member   Pertinent History CVA in 2007, Diabetes per medical chart, HTN   Patient Stated Goals Walk better and use L arm to cook and dress herself   Currently in Pain? No/denies                         [facility] Adult PT Treatment/Exercise - 09/19/14 0903    Balance   Balance Assessed Yes   Static Standing Balance   Static Standing - Balance Support Right upper extremity supported;No upper extremity supported   Static Standing - Level of Assistance 5: Stand by assistance;Other (comment)  min guard   Static Standing - Comment/# of Minutes Performed in corner with chair in front of pt for safety; B LEs, non-compliant/compliant surfaces; 2-3 sets with 10-30 second holds: feet apart/together with eyes open/closed, feet  apart/together with head turns, tandem stance, modified tandem stance, and single leg stance with 1 UE support. 3 LOB episodes noted which required UE support or wall support to maintain balance. VC's and demonstration for technique.                 PT Education - 09/19/14 1219    Education provided Yes   Education Details CVA education and balance HEP   Person(s) Educated Patient;Spouse   Methods Explanation;Demonstration;Verbal cues;Handout   Comprehension Verbalized understanding;Returned demonstration          PT Short Term Goals - 09/16/14 2132    PT SHORT TERM GOAL #1   Title Pt will be independent in HEP to improve strength, balance, and safety during functional mobility. Target date: 10/07/14.   Status On-going   PT SHORT TERM GOAL #2   Title Pt will improve BERG score to >/=44/56 to decrease falls risk. Target date: 10/07/14.   Status On-going   PT SHORT TERM GOAL #3   Title Pt will ambulate 300' over even terrain with LRAD at MOD I level to improve functional mobilty. Target date: 10/07/14.   Status On-going   PT SHORT TERM GOAL #4   Title Pt will perform TUG with LRAD in </=13.5 seconds to decrease falls risk. Target date: 10/07/14.   Status On-going  PT Long Term Goals - 09/16/14 2133    PT LONG TERM GOAL #1   Title Pt will verbalize understanding of CVA symptoms and risk factors to reduce risk of future CVA. Target date: 11/04/14.   Status On-going   PT LONG TERM GOAL #2   Title Pt will ambulate 600' over even/uneven terrain with LRAD at MOD I level to improve functional mobility and to play with her grandchildren. Target dat: 11/04/14.   Status On-going   PT LONG TERM GOAL #3   Title Pt will improve BERG score to >/=48/56 to decrease falls risk. Target date: 11/04/14.   Status On-going   PT LONG TERM GOAL #4   Title Pt will improve her gait speed with LRAD to >/=1.74ft/sec. to reduce falls risk. Target date: 11/04/14.   Status On-going   PT LONG TERM  GOAL #5   Title Pt will improve FOTO score by 10 points to improve quality of life. Target date: 11/04/14.   Status On-going   PT LONG TERM GOAL #6   Title Pt will be able to peform reaching outside BOS activities (5 inches) while standing with feet apart for 5 minutes,without LOB, at MOD I level in order to cook at home. Target date: 11/04/14.   Status On-going               Plan - 09/19/14 1220    Clinical Impression Statement Pt experienced increased postural sway and 3 LOB episodes during feet together with eyes closed, tandem stance, and while standing on pillow with head turns. Pt demonstrated progress towards goals, as she required less cues during balance HEP during 2 set. Continue with POC.   Pt will benefit from skilled therapeutic intervention in order to improve on the following deficits Abnormal gait;Decreased endurance;Decreased knowledge of use of DME;Decreased balance;Decreased mobility;Decreased strength;Impaired UE functional use;Decreased range of motion;Other (comment)  orthotic fitting/training   Rehab Potential Fair   Clinical Impairments Affecting Rehab Potential CVA was 8.5 years ago   PT Frequency 2x / week   PT Duration 8 weeks   PT Treatment/Interventions ADLs/Self Care Home Management;Gait training;Neuromuscular re-education;Stair training;Biofeedback;Functional mobility training;Patient/family education;Therapeutic activities;Electrical Stimulation;Therapeutic exercise;Manual techniques;DME Instruction;Balance training;Other (comment)  orthotic fitting/training   PT Next Visit Plan power/strength training for gastroc/ant. tib's, dynamic gait with and without SPC. Send MD note requesting OT, if he has not gotten back to [place].   Consulted and Agree with Plan of Care Patient;Family member/caregiver   Family Member Consulted pt's husband-[name]?        Problem List Patient Active Problem List   Diagnosis Date Noted  . Dyspnea 08/27/2013  . Abnormal ECG  08/27/2013  . Stroke   . Hypertension   . Diabetes   . Obesity   . Hyperlipidemia     Toree Edling L 09/19/2014, 12:24 PM  [facility] [address] [place], [place], [address] Phone: [phone]   Fax:  [phone]      Geoffry Paradise, PT,DPT 09/19/2014 12:24 PM Phone: [phone] Fax: [phone]

## 2014-09-22 ENCOUNTER — Ambulatory Visit: Payer: 59 | Admitting: Physical Therapy

## 2014-09-22 ENCOUNTER — Encounter: Payer: Self-pay | Admitting: Physical Therapy

## 2014-09-22 DIAGNOSIS — R269 Unspecified abnormalities of gait and mobility: Secondary | ICD-10-CM

## 2014-09-22 DIAGNOSIS — R531 Weakness: Secondary | ICD-10-CM

## 2014-09-22 NOTE — Therapy (Signed)
[name] [address] [place], [place], [address] Phone: [phone]   Fax:  [phone]  Physical Therapy Treatment  Patient Details  Name: Erica Cooper MRN: [id number] Date of Birth: 15-Sep-1950 Referring Provider:  Tillman Sers, MD  Encounter Date: 09/22/2014      PT End of Session - 09/22/14 1015    Visit Number 4   Number of Visits 17   Date for PT Re-Evaluation 11/08/14   Authorization Type G-code every 10th visit.   PT Start Time 1015   PT Stop Time 1108   PT Time Calculation (min) 53 min   Equipment Utilized During Treatment Gait belt   Activity Tolerance Patient tolerated treatment well   Behavior During Therapy WFL for tasks assessed/performed      Past Medical History  Diagnosis Date  . Stroke   . Hypertension   . Diabetes   . Obesity   . Hyperlipidemia     History reviewed. No pertinent past surgical history.  There were no vitals filed for this visit.  Visit Diagnosis:  Abnormality of gait  Left-sided weakness      Subjective Assessment - 09/22/14 1024    Subjective went to church with son's help. no falls or issues.   Currently in Pain? No/denies      Therapeutic Exercise: Patient demonstrated initial HEP / supine with PT giving verbal & tactile cues to patient and husband. Patient demonstrate HEP in corner for balance /vestibular exercises with tactile & verbal cues. Standing on pillow was not as challenging as 1" foam. See patient education.                           PT Education - 09/22/14 1015    Education provided Yes   Education Details reviewed HEPs with writing on each exercise to clarify   Person(s) Educated Patient;Spouse;Child(ren)   Methods Explanation;Demonstration;Verbal cues;Handout;Tactile cues   Comprehension Verbalized understanding;Returned demonstration;Verbal cues required;Tactile cues required;Need further instruction           PT Short Term Goals - 09/16/14 2132    PT SHORT TERM GOAL #1   Title Pt will be independent in HEP to improve strength, balance, and safety during functional mobility. Target date: 10/07/14.   Status On-going   PT SHORT TERM GOAL #2   Title Pt will improve BERG score to >/=44/56 to decrease falls risk. Target date: 10/07/14.   Status On-going   PT SHORT TERM GOAL #3   Title Pt will ambulate 300' over even terrain with LRAD at MOD I level to improve functional mobilty. Target date: 10/07/14.   Status On-going   PT SHORT TERM GOAL #4   Title Pt will perform TUG with LRAD in </=13.5 seconds to decrease falls risk. Target date: 10/07/14.   Status On-going           PT Long Term Goals - 09/16/14 2133    PT LONG TERM GOAL #1   Title Pt will verbalize understanding of CVA symptoms and risk factors to reduce risk of future CVA. Target date: 11/04/14.   Status On-going   PT LONG TERM GOAL #2   Title Pt will ambulate 600' over even/uneven terrain with LRAD at MOD I level to improve functional mobility and to play with her grandchildren. Target dat: 11/04/14.   Status On-going   PT LONG TERM GOAL #3   Title Pt will improve BERG score to >/=48/56 to decrease falls risk.  Target date: 11/04/14.   Status On-going   PT LONG TERM GOAL #4   Title Pt will improve her gait speed with LRAD to >/=1.52ft/sec. to reduce falls risk. Target date: 11/04/14.   Status On-going   PT LONG TERM GOAL #5   Title Pt will improve FOTO score by 10 points to improve quality of life. Target date: 11/04/14.   Status On-going   PT LONG TERM GOAL #6   Title Pt will be able to peform reaching outside BOS activities (5 inches) while standing with feet apart for 5 minutes,without LOB, at MOD I level in order to cook at home. Target date: 11/04/14.   Status On-going               Plan - 09/22/14 1015    Clinical Impression Statement Patient and husband appear to have better understanding of HEP.   Pt will benefit from skilled  therapeutic intervention in order to improve on the following deficits Abnormal gait;Decreased endurance;Decreased knowledge of use of DME;Decreased balance;Decreased mobility;Decreased strength;Impaired UE functional use;Decreased range of motion;Other (comment)  orthotic fitting/training   Rehab Potential Fair   Clinical Impairments Affecting Rehab Potential CVA was 8.5 years ago   PT Frequency 2x / week   PT Duration 8 weeks   PT Treatment/Interventions ADLs/Self Care Home Management;Gait training;Neuromuscular re-education;Stair training;Biofeedback;Functional mobility training;Patient/family education;Therapeutic activities;Electrical Stimulation;Therapeutic exercise;Manual techniques;DME Instruction;Balance training;Other (comment)  orthotic fitting/training   PT Next Visit Plan power/strength training for gastroc/ant. tib's, dynamic gait with and without SPC. Send MD note requesting OT, if he has not gotten back to [place].   Consulted and Agree with Plan of Care Patient;Family member/caregiver   Family Member Consulted pt's husband-[name]?        Problem List Patient Active Problem List   Diagnosis Date Noted  . Dyspnea 08/27/2013  . Abnormal ECG 08/27/2013  . Stroke   . Hypertension   . Diabetes   . Obesity   . Hyperlipidemia     Ivionna Verley PT, DPT 09/22/2014, 9:18 PM  [facility] [address] [place], [place], [address] Phone: [phone]   Fax:  [phone]

## 2014-09-23 ENCOUNTER — Ambulatory Visit (AMBULATORY_SURGERY_CENTER): Payer: Self-pay

## 2014-09-23 VITALS — Ht 61.0 in | Wt 177.4 lb

## 2014-09-23 DIAGNOSIS — Z1211 Encounter for screening for malignant neoplasm of colon: Secondary | ICD-10-CM

## 2014-09-23 MED ORDER — NA SULFATE-K SULFATE-MG SULF 17.5-3.13-1.6 GM/177ML PO SOLN: ORAL | Status: DC

## 2014-09-23 NOTE — Progress Notes (Signed)
Per pt, no allergies to soy or egg products.Pt not taking any weight loss meds or using  O2 at home.   Suprep sample 1 kit #[id number] Exp 02/18 was given to pt.

## 2014-09-25 ENCOUNTER — Encounter: Payer: Self-pay | Admitting: Physical Therapy

## 2014-09-25 ENCOUNTER — Ambulatory Visit: Payer: 59 | Admitting: Physical Therapy

## 2014-09-25 DIAGNOSIS — R269 Unspecified abnormalities of gait and mobility: Secondary | ICD-10-CM | POA: Diagnosis not present

## 2014-09-25 DIAGNOSIS — R531 Weakness: Secondary | ICD-10-CM

## 2014-09-25 NOTE — Therapy (Signed)
[facility] [address] [place], [place], [address] Phone: [phone]   Fax:  [phone]  Physical Therapy Treatment  Patient Details  Name: Erica Cooper MRN: [id number] Date of Birth: [birth year] Referring Provider:  Tillman Sers, MD  Encounter Date: 09/25/2014      PT End of Session - 09/25/14 1015    Visit Number 5   Number of Visits 17   Date for PT Re-Evaluation 11/08/14   Authorization Type G-code every 10th visit.   PT Start Time 1015   PT Stop Time 1100   PT Time Calculation (min) 45 min   Equipment Utilized During Treatment Gait belt   Activity Tolerance Patient tolerated treatment well   Behavior During Therapy WFL for tasks assessed/performed      Past Medical History  Diagnosis Date  . Stroke 2007    weakness left side  . Hypertension   . Diabetes     borderline/ no meds  . Obesity   . Hyperlipidemia   . GERD (gastroesophageal reflux disease)     Past Surgical History  Procedure Laterality Date  . No past surgeries      There were no vitals filed for this visit.  Visit Diagnosis:  Abnormality of gait  Left-sided weakness      Subjective Assessment - 09/25/14 1022    Subjective did exercises at least once since last appt but had endoscopy so limted. Reports understands updates with no issues. No falls.   Currently in Pain? No/denies     Therapeutic Exercise: See patient education and instruction in stretching program seated and rounding fitness plan with exercises previously instructed and today plus endurance. PT instructed pt, husband and son in set-up and use of NuStep. PT used ace wrap to assist left hand grip on NuStep. Patient performed NuStep with 4 extremities Level 2 for 10 minutes with cues on ROM, technique & pace.                             PT Education - 09/25/14 1015    Education provided Yes   Education Details HEP for stretching, 4  components of exercise program (balance, strength, flexibiity, endurance), [employer]) Educated Patient;Spouse;Child(ren)   Methods Explanation;Demonstration;Tactile cues;Verbal cues;Handout   Comprehension Verbalized understanding;Returned demonstration;Verbal cues required;Tactile cues required;Need further instruction          PT Short Term Goals - 09/16/14 2132    PT SHORT TERM GOAL #1   Title Pt will be independent in HEP to improve strength, balance, and safety during functional mobility. Target date: 10/07/14.   Status On-going   PT SHORT TERM GOAL #2   Title Pt will improve BERG score to >/=44/56 to decrease falls risk. Target date: 10/07/14.   Status On-going   PT SHORT TERM GOAL #3   Title Pt will ambulate 300' over even terrain with LRAD at MOD I level to improve functional mobilty. Target date: 10/07/14.   Status On-going   PT SHORT TERM GOAL #4   Title Pt will perform TUG with LRAD in </=13.5 seconds to decrease falls risk. Target date: 10/07/14.   Status On-going           PT Long Term Goals - 09/16/14 2133    PT LONG TERM GOAL #1   Title Pt will verbalize understanding of CVA symptoms and risk factors to reduce risk of future CVA. Target date: 11/04/14.  Status On-going   PT LONG TERM GOAL #2   Title Pt will ambulate 600' over even/uneven terrain with LRAD at MOD I level to improve functional mobility and to play with her grandchildren. Target dat: 11/04/14.   Status On-going   PT LONG TERM GOAL #3   Title Pt will improve BERG score to >/=48/56 to decrease falls risk. Target date: 11/04/14.   Status On-going   PT LONG TERM GOAL #4   Title Pt will improve her gait speed with LRAD to >/=1.44ft/sec. to reduce falls risk. Target date: 11/04/14.   Status On-going   PT LONG TERM GOAL #5   Title Pt will improve FOTO score by 10 points to improve quality of life. Target date: 11/04/14.   Status On-going   PT LONG TERM GOAL #6   Title Pt will be able to peform  reaching outside BOS activities (5 inches) while standing with feet apart for 5 minutes,without LOB, at MOD I level in order to cook at home. Target date: 11/04/14.   Status On-going               Plan - 09/25/14 1015    Clinical Impression Statement Patient, husband and son appear to understand the components of well-rounded fitness plan. Patient would benefit from joining [facility].   Pt will benefit from skilled therapeutic intervention in order to improve on the following deficits Abnormal gait;Decreased endurance;Decreased knowledge of use of DME;Decreased balance;Decreased mobility;Decreased strength;Impaired UE functional use;Decreased range of motion;Other (comment)  orthotic fitting/training   Rehab Potential Fair   Clinical Impairments Affecting Rehab Potential CVA was 8.5 years ago   PT Frequency 2x / week   PT Duration 8 weeks   PT Treatment/Interventions ADLs/Self Care Home Management;Gait training;Neuromuscular re-education;Stair training;Biofeedback;Functional mobility training;Patient/family education;Therapeutic activities;Electrical Stimulation;Therapeutic exercise;Manual techniques;DME Instruction;Balance training;Other (comment)  orthotic fitting/training   PT Next Visit Plan power/strength training for gastroc/ant. tib's, dynamic gait with and without SPC. Send MD note requesting OT, if he has not gotten back to [place].   Consulted and Agree with Plan of Care Patient;Family member/caregiver   Family Member Consulted pt's husband-[name]?        Problem List Patient Active Problem List   Diagnosis Date Noted  . Dyspnea 08/27/2013  . Abnormal ECG 08/27/2013  . Stroke   . Hypertension   . Diabetes   . Obesity   . Hyperlipidemia     Kourtlyn Charlet PT, DPT 09/26/2014, 12:15 AM  [facility] [address] [place], [place], [address] Phone: [phone]   Fax:  [phone]

## 2014-09-25 NOTE — Patient Instructions (Signed)
Chair Sitting   Sit at edge of seat, spine straight, one leg extended. Put a hand on each thigh and bend forward from the hip, keeping spine straight. Allow hand on extended leg to reach toward toes. Support upper body with other arm. Hold _20__ seconds. Repeat _2-3__ times per session. Do __1-2_ sessions per day. Do on  Both legs  Copyright  VHI. All rights reserved.  Gastroc / Heel Cord Stretch - Seated With Towel   Sit with heelon floor with knee straight, looped towel around ball of foot. Gently pull foot in toward body, stretching heel cord and calf. Hold for _20__ seconds. Do on right leg then Repeat on involved leg. Repeat __2-3_ times. Do _1-2__ times per day.  Copyright  VHI. All rights reserved.  Torso Turn   Turn back to look behind you  arms out to sides. rotate torso to the right. bring torso back to center. Repeat to other side. Hold 20 seconds each time Repeat __2-3_ times, alternating sides. Do _1-2__ times per day.  Copyright  VHI. All rights reserved.

## 2014-09-28 DIAGNOSIS — K922 Gastrointestinal hemorrhage, unspecified: Secondary | ICD-10-CM

## 2014-09-28 HISTORY — DX: Gastrointestinal hemorrhage, unspecified: K92.2

## 2014-09-30 ENCOUNTER — Ambulatory Visit: Payer: 59 | Attending: Internal Medicine

## 2014-09-30 ENCOUNTER — Ambulatory Visit: Payer: 59 | Admitting: Occupational Therapy

## 2014-09-30 DIAGNOSIS — M6289 Other specified disorders of muscle: Secondary | ICD-10-CM | POA: Insufficient documentation

## 2014-09-30 DIAGNOSIS — G8110 Spastic hemiplegia affecting unspecified side: Secondary | ICD-10-CM

## 2014-09-30 DIAGNOSIS — R269 Unspecified abnormalities of gait and mobility: Secondary | ICD-10-CM | POA: Insufficient documentation

## 2014-09-30 DIAGNOSIS — R531 Weakness: Secondary | ICD-10-CM

## 2014-09-30 DIAGNOSIS — IMO0002 Reserved for concepts with insufficient information to code with codable children: Secondary | ICD-10-CM

## 2014-09-30 NOTE — Patient Instructions (Signed)
Hip Flexor Stretch   Lying on back near edge of bed, bend one leg, foot flat. Hang other leg over edge, relaxed, thigh resting entirely on bed for _3___ minutes. Repeat __1__ times. Do __2__ sessions per day. Advanced Exercise: Bend knee back keeping thigh in contact with bed.  http://[email or web address].exer.us/347   Copyright  VHI. All rights reserved.

## 2014-09-30 NOTE — Therapy (Signed)
[facility] [address] [place], [place], [address] Phone: [phone]   Fax:  [phone]  Occupational Therapy Evaluation  Patient Details  Name: Erica Cooper MRN: [id number] Date of Birth: 05-Jun-1950 Referring Provider:  Nolene Ebbs, MD  Encounter Date: 09/30/2014      OT End of Session - 09/30/14 1430    Visit Number 1  G1   Number of Visits 9   Date for OT Re-Evaluation 10/31/14   Authorization Type [employer] Medicare   OT Start Time 1005   OT Stop Time 1100   OT Time Calculation (min) 55 min   Activity Tolerance Patient tolerated treatment well      Past Medical History  Diagnosis Date  . Stroke 2007    weakness left side  . Hypertension   . Diabetes     borderline/ no meds  . Obesity   . Hyperlipidemia   . GERD (gastroesophageal reflux disease)     Past Surgical History  Procedure Laterality Date  . No past surgeries      There were no vitals filed for this visit.  Visit Diagnosis:  Spastic hemiplegia affecting nondominant side - Plan: Ot plan of care cert/re-cert  Lack of coordination due to stroke - Plan: Ot plan of care cert/re-cert      Subjective Assessment - 09/30/14 1011    Patient is accompained by: Family member  spouse   Pertinent History CVA w/ residual Lt hemiparesis (Aug 2007)   Patient Stated Goals I want to work on my Lt arm   Currently in Pain? No/denies           [facility] OT Assessment - 09/30/14 0001    Assessment   Diagnosis Lt hemiplegia from CVA 2007   Onset Date 01/02/06   Prior Therapy In [place]   Precautions   Precautions Fall   Balance Screen   Has the patient fallen in the past 6 months No   Has the patient had a decrease in activity level because of a fear of falling?  No   Is the patient reluctant to leave their home because of a fear of falling?  No   Home  Environment   Family/patient expects to be discharged to: Private residence   Living  Arrangements Spouse/significant other  and grown son and his family   Type of [place] One level   Bathroom Shower/Tub Tub/Shower unit;Curtain   Adaptive equipment --  grab bar in [place] - single point   Additional Comments uses cane some in community, but not at home   Lives With --  spouse, and son with family with 3 STE, 1 story home   Prior Function   Level of Independence Independent with basic ADLs;Needs assistance with homemaking  with modifications (one handed), assist with bra, cutting    ADL   ADL comments assist with cutting food, Mod I for BADLS except occasional assist hooking bra. Min assist cooking to cut vegetables or lifting heavy pots. Pt's daughter-n-law does grocery shopping and cleaning. Pt does some laundering, but primarily daughter-n-law   Mobility   Mobility Status Independent  with cane prn   Written Expression   Dominant Hand Right   Vision - History   Additional Comments denies change. Pt reports no longer needs glasses for reading but needs for small print distance reading   Sensation   Light Touch Appears Intact  Hot/Cold Appears Intact   Coordination   9 Hole Peg Test Left  unable   Box and Blocks Rt = 51, Lt = 5   Coordination Pt unable to pick up pen off table with Lt hand secondary limited thumb movement, however pt able to pick up 1" block, small cone, and ball with compensations (Lt thumb remains adducted and IP flexion, will grab with fingers and dorsal side of distal pahanx of thumb)   ROM / Strength   AROM / PROM / Strength AROM;Strength   AROM   Overall AROM Comments RUE: WNL's. LUE: Shoulder flex = 50*, abd = 40*, elbow flex approx 75%, ext approx. 90%, pronation/supination approx 75% with support. Gross finger flexion approx. 60%, gross finger ext = 75-90% however thumb stays in adduction and IP flexion.    Hand Function   Right Hand Grip (lbs) 58 lbs   Left Hand Grip (lbs) 20  lbs                              OT Long Term Goals - 10-11-2014 1435    OT LONG TERM GOAL #1   Title Independent w/ HEP for LUE (DUE 10/31/14)   Time 4   Period Weeks   Status New   OT LONG TERM GOAL #2   Title Verbalize understanding with potential A/E needs for greater ease/safety with cooking/IADLS and how to acquire A/E   Time 4   Period Weeks   Status New   OT LONG TERM GOAL #3   Title Independent with splint wear and care for Lt thumb   Time 4   Period Weeks   Status New   OT LONG TERM GOAL #4   Title Improve LUE function as evidenced by performing 10 blocks or more on Box & Blocks test   Baseline 5   Time 4   Period Weeks   Status New               Plan - 2014-10-11 1431    Clinical Impression Statement Pt is a 64 y.o. female who presents to outpatient rehab s/p Lt hemiplegia (non dominant side) from CVA in 2007. Pt with no recent changes or decline, however reports LUE has gotten more movement recently   Pt will benefit from skilled therapeutic intervention in order to improve on the following deficits (Retired) Decreased knowledge of use of DME;Impaired flexibility;Decreased coordination;Decreased mobility;Improper body mechanics;Decreased strength;Decreased range of motion;Impaired tone;Decreased activity tolerance;Decreased endurance;Impaired UE functional use   Rehab Potential Good   OT Frequency 2x / week   OT Duration 4 weeks  plus evaluation   OT Treatment/Interventions Self-care/ADL training;Electrical Stimulation;Therapeutic exercise;Moist Heat;Neuromuscular education;Splinting;Fluidtherapy;Functional Mobility Training;Patient/family education;DME and/or AE instruction;Manual Therapy;Passive range of motion;Therapeutic activities;Balance training   Plan Show and provide handouts for A/E (one handed cutting board, one handed can opener, rocker knife, pot stabalizer for stove use). Next session: splint for Lt thumb abd.   Consulted and  Agree with Plan of Care Patient;Family member/caregiver          G-Codes - 2014/10/11 1441    Functional Assessment Tool Used Box & Blocks Lt = 5, 9 hole peg test Lt = unable, Grip Lt = 20 lbs   Functional Limitation Carrying, moving and handling objects   Carrying, Moving and Handling Objects Current Status (F6213) At least 80 percent but less than 100 percent impaired, limited or restricted   Carrying, Moving and Handling Objects  Goal Status (Z6109) At least 60 percent but less than 80 percent impaired, limited or restricted      Problem List Patient Active Problem List   Diagnosis Date Noted  . Dyspnea 08/27/2013  . Abnormal ECG 08/27/2013  . Stroke   . Hypertension   . Diabetes   . Obesity   . Hyperlipidemia     Carey Bullocks, OTR/L 09/30/2014, 2:42 PM  [facility] [address] [place], [place], [address] Phone: [phone]   Fax:  [phone]

## 2014-09-30 NOTE — Therapy (Signed)
[facility] [address] [place], [place], [address] Phone: [phone]   Fax:  [phone]  Physical Therapy Treatment  Patient Details  Name: Erica Cooper MRN: [id number] Date of Birth: 06-Feb-1951 Referring Provider:  Tillman Sers, MD  Encounter Date: 09/30/2014      PT End of Session - 09/30/14 1312    Visit Number 6   Number of Visits 17   Date for PT Re-Evaluation 11/08/14   Authorization Type G-code every 10th visit.   PT Start Time [phone]   PT Stop Time 0929   PT Time Calculation (min) 40 min   Equipment Utilized During Treatment Gait belt   Activity Tolerance Patient tolerated treatment well   Behavior During Therapy [facility] for tasks assessed/performed      Past Medical History  Diagnosis Date  . Stroke 2007    weakness left side  . Hypertension   . Diabetes     borderline/ no meds  . Obesity   . Hyperlipidemia   . GERD (gastroesophageal reflux disease)     Past Surgical History  Procedure Laterality Date  . No past surgeries      There were no vitals filed for this visit.  Visit Diagnosis:  Abnormality of gait  Left-sided weakness      Subjective Assessment - 09/30/14 0852    Subjective Pt denied falls since last visit. Pt had a sore throat last week but reported she finished antibiotics prescribed by MD.   Patient is accompained by: Family member   Pertinent History CVA in 2007, Diabetes per medical chart, HTN   Patient Stated Goals Walk better and use L arm to cook and dress herself   Currently in Pain? No/denies       Therex: -Supine hip flexor stretch (L LE only) 2x30 second holds. VC's for technique.                  [facility] Adult PT Treatment/Exercise - 09/30/14 0854    Ambulation/Gait   Ambulation/Gait Yes   Ambulation/Gait Assistance 4: Min guard   Ambulation/Gait Assistance Details VC's to improve L knee flexion and L dorsiflexion and to decrease L circumduction.  Pt ambulated in //bars with pt facilitating L knee flexion with pt using 0-1 UE support on bars. pt required one seated rest break 2/2 fatigue.   Ambulation Distance (Feet) --  117'x2, 20x7' in //bars   Assistive device None   Gait Pattern Step-through pattern;Decreased dorsiflexion - left;Decreased hip/knee flexion - left;Decreased stride length;Left circumduction;Wide base of support;Decreased trunk rotation   Ambulation Surface Level;Indoor                PT Education - 09/30/14 1311    Education provided Yes   Education Details L hip flexor stretch   Person(s) Educated Patient;Spouse   Methods Explanation;Tactile cues;Verbal cues;Handout   Comprehension Verbalized understanding;Returned demonstration          PT Short Term Goals - 09/16/14 2132    PT SHORT TERM GOAL #1   Title Pt will be independent in HEP to improve strength, balance, and safety during functional mobility. Target date: 10/07/14.   Status On-going   PT SHORT TERM GOAL #2   Title Pt will improve BERG score to >/=44/56 to decrease falls risk. Target date: 10/07/14.   Status On-going   PT SHORT TERM GOAL #3   Title Pt will ambulate 300' over even terrain with LRAD at MOD I level to improve functional mobilty. Target  date: 10/07/14.   Status On-going   PT SHORT TERM GOAL #4   Title Pt will perform TUG with LRAD in </=13.5 seconds to decrease falls risk. Target date: 10/07/14.   Status On-going           PT Long Term Goals - 09/16/14 2133    PT LONG TERM GOAL #1   Title Pt will verbalize understanding of CVA symptoms and risk factors to reduce risk of future CVA. Target date: 11/04/14.   Status On-going   PT LONG TERM GOAL #2   Title Pt will ambulate 600' over even/uneven terrain with LRAD at MOD I level to improve functional mobility and to play with her grandchildren. Target dat: 11/04/14.   Status On-going   PT LONG TERM GOAL #3   Title Pt will improve BERG score to >/=48/56 to decrease falls risk.  Target date: 11/04/14.   Status On-going   PT LONG TERM GOAL #4   Title Pt will improve her gait speed with LRAD to >/=1.1ft/sec. to reduce falls risk. Target date: 11/04/14.   Status On-going   PT LONG TERM GOAL #5   Title Pt will improve FOTO score by 10 points to improve quality of life. Target date: 11/04/14.   Status On-going   PT LONG TERM GOAL #6   Title Pt will be able to peform reaching outside BOS activities (5 inches) while standing with feet apart for 5 minutes,without LOB, at MOD I level in order to cook at home. Target date: 11/04/14.   Status On-going               Plan - 09/30/14 1312    Clinical Impression Statement Pt demonstrated improve L knee flexion during ambulation, after performing L hip flexor stretch.  Pt would continue to benefit from skilled PT to improve safety during functional mobility. Pt had OT eval scheduled today.   Pt will benefit from skilled therapeutic intervention in order to improve on the following deficits Abnormal gait;Decreased endurance;Decreased knowledge of use of DME;Decreased balance;Decreased mobility;Decreased strength;Impaired UE functional use;Decreased range of motion;Other (comment)  orthotic training/fitting   Rehab Potential Fair   Clinical Impairments Affecting Rehab Potential CVA was 8.5 years ago   PT Frequency 2x / week   PT Duration 8 weeks   PT Treatment/Interventions ADLs/Self Care Home Management;Gait training;Neuromuscular re-education;Stair training;Biofeedback;Functional mobility training;Patient/family education;Therapeutic activities;Electrical Stimulation;Therapeutic exercise;Manual techniques;DME Instruction;Balance training;Other (comment)  orthotic training/fitting   PT Next Visit Plan Trial L AFO to improve DF.   Consulted and Agree with Plan of Care Patient;Family member/caregiver   Family Member Consulted pt's husband-[name]?        Problem List Patient Active Problem List   Diagnosis Date Noted  .  Dyspnea 08/27/2013  . Abnormal ECG 08/27/2013  . Stroke   . Hypertension   . Diabetes   . Obesity   . Hyperlipidemia     [name] L 09/30/2014, 1:15 PM  [facility] [address] [place], [place], [address] Phone: [phone]   Fax:  [phone]     Geoffry Paradise, PT,DPT 09/30/2014 1:15 PM Phone: [phone] Fax: [phone]

## 2014-10-01 ENCOUNTER — Encounter: Payer: Self-pay | Admitting: Gastroenterology

## 2014-10-02 ENCOUNTER — Ambulatory Visit: Payer: 59 | Admitting: Physical Therapy

## 2014-10-02 DIAGNOSIS — R269 Unspecified abnormalities of gait and mobility: Secondary | ICD-10-CM | POA: Diagnosis not present

## 2014-10-02 NOTE — Therapy (Signed)
[facility] [address] [place], [place], [address] Phone: [phone]   Fax:  [phone]  Physical Therapy Treatment  Patient Details  Name: Erica Cooper MRN: [id number] Date of Birth: 07/11/1950 Referring Provider:  Tillman Sers, MD  Encounter Date: 10/02/2014      PT End of Session - 10/02/14 0953    Visit Number 7   Number of Visits 17   Date for PT Re-Evaluation 11/08/14   Authorization Type G-code every 10th visit.   PT Start Time 0806   PT Stop Time 0847   PT Time Calculation (min) 41 min   Equipment Utilized During Treatment Gait belt   Activity Tolerance Patient tolerated treatment well   Behavior During Therapy WFL for tasks assessed/performed      Past Medical History  Diagnosis Date  . Stroke 2007    weakness left side  . Hypertension   . Diabetes     borderline/ no meds  . Obesity   . Hyperlipidemia   . GERD (gastroesophageal reflux disease)     Past Surgical History  Procedure Laterality Date  . No past surgeries      There were no vitals filed for this visit.  Visit Diagnosis:  Abnormality of gait      Subjective Assessment - 10/02/14 0846    Subjective Denies falls or changes.   Patient is accompained by: Family member   Pertinent History CVA in 2007, Diabetes per medical chart, HTN   Patient Stated Goals Walk better and use L arm to cook and dress herself   Currently in Pain? No/denies                         [facility] Adult PT Treatment/Exercise - 10/02/14 0840    Transfers   Transfers Sit to Stand;Stand to Sit   Sit to Stand 5: Supervision;With upper extremity assist;With armrests;From chair/3-in-1   Stand to Sit 5: Supervision;With upper extremity assist;With armrests;To chair/3-in-1   Ambulation/Gait   Ambulation/Gait Yes   Ambulation/Gait Assistance 4: Min guard   Ambulation/Gait Assistance Details trialed various L AFO's (toe off, ottobach, ottobach  reaction, plastic) with little change in gait pattern.  Still with difficulty clearig L LE and mid foot catching.  Continues with decreased L knee & hip flexion with gait making L swing difficult.   Ambulation Distance (Feet) 100 Feet  multiple times trialing AFO   Assistive device None   Gait Pattern Step-through pattern;Decreased dorsiflexion - left;Decreased hip/knee flexion - left;Decreased stride length;Left circumduction;Wide base of support;Decreased trunk rotation   Ambulation Surface Level;Indoor   Knee/Hip Exercises: Aerobic   Stationary Bike Nustep workload 3 with all 4 extremities (LUE ace wrapped to handle) x 8 minutes for flexibility                PT Education - 10/02/14 0952    Education provided Yes   Education Details Purpose of AFO, AFO not improving gait pattern   Person(s) Educated Patient;Spouse   Methods Explanation;Demonstration   Comprehension Verbalized understanding          PT Short Term Goals - 09/16/14 2132    PT SHORT TERM GOAL #1   Title Pt will be independent in HEP to improve strength, balance, and safety during functional mobility. Target date: 10/07/14.   Status On-going   PT SHORT TERM GOAL #2   Title Pt will improve BERG score to >/=44/56 to decrease falls risk. Target date: 10/07/14.  Status On-going   PT SHORT TERM GOAL #3   Title Pt will ambulate 300' over even terrain with LRAD at MOD I level to improve functional mobilty. Target date: 10/07/14.   Status On-going   PT SHORT TERM GOAL #4   Title Pt will perform TUG with LRAD in </=13.5 seconds to decrease falls risk. Target date: 10/07/14.   Status On-going           PT Long Term Goals - 09/16/14 2133    PT LONG TERM GOAL #1   Title Pt will verbalize understanding of CVA symptoms and risk factors to reduce risk of future CVA. Target date: 11/04/14.   Status On-going   PT LONG TERM GOAL #2   Title Pt will ambulate 600' over even/uneven terrain with LRAD at MOD I level to improve  functional mobility and to play with her grandchildren. Target dat: 11/04/14.   Status On-going   PT LONG TERM GOAL #3   Title Pt will improve BERG score to >/=48/56 to decrease falls risk. Target date: 11/04/14.   Status On-going   PT LONG TERM GOAL #4   Title Pt will improve her gait speed with LRAD to >/=1.35ft/sec. to reduce falls risk. Target date: 11/04/14.   Status On-going   PT LONG TERM GOAL #5   Title Pt will improve FOTO score by 10 points to improve quality of life. Target date: 11/04/14.   Status On-going   PT LONG TERM GOAL #6   Title Pt will be able to peform reaching outside BOS activities (5 inches) while standing with feet apart for 5 minutes,without LOB, at MOD I level in order to cook at home. Target date: 11/04/14.   Status On-going               Plan - 10/02/14 9450    Clinical Impression Statement Pt with little improvement in gait pattern with various AFO's on L.  Continues with decreased LLE strength as well as tone/tightness in quads.  Continue PT per POC.   Pt will benefit from skilled therapeutic intervention in order to improve on the following deficits Abnormal gait;Decreased endurance;Decreased knowledge of use of DME;Decreased balance;Decreased mobility;Decreased strength;Impaired UE functional use;Decreased range of motion;Other (comment)  orthotic training/fitting   Rehab Potential Fair   Clinical Impairments Affecting Rehab Potential CVA was 8.5 years ago   PT Frequency 2x / week   PT Duration 8 weeks   PT Treatment/Interventions ADLs/Self Care Home Management;Gait training;Neuromuscular re-education;Stair training;Biofeedback;Functional mobility training;Patient/family education;Therapeutic activities;Electrical Stimulation;Therapeutic exercise;Manual techniques;DME Instruction;Balance training;Other (comment)  orthotic training/fitting   PT Next Visit Plan Begin checking goals.  Nustep first for flexibility.  Trial foot pedaler as possible option for home  use.  Gait.   Consulted and Agree with Plan of Care Patient;Family member/caregiver   Family Member Consulted pt's husband-[name]?        Problem List Patient Active Problem List   Diagnosis Date Noted  . Dyspnea 08/27/2013  . Abnormal ECG 08/27/2013  . Stroke   . Hypertension   . Diabetes   . Obesity   . Hyperlipidemia     Narda Bonds 10/02/2014, 10:01 AM  [facility] [address] [place], [place], [address] Phone: [phone]   Fax:  [place], [place] 10/02/2014 10:01 AM Phone: [phone] Fax: [phone]

## 2014-10-07 ENCOUNTER — Other Ambulatory Visit: Payer: Self-pay | Admitting: Gastroenterology

## 2014-10-07 ENCOUNTER — Other Ambulatory Visit (HOSPITAL_COMMUNITY): Payer: Self-pay

## 2014-10-07 ENCOUNTER — Ambulatory Visit (AMBULATORY_SURGERY_CENTER): Payer: 59 | Admitting: Gastroenterology

## 2014-10-07 ENCOUNTER — Observation Stay (HOSPITAL_COMMUNITY)
Admission: EM | Admit: 2014-10-07 | Discharge: 2014-10-09 | Disposition: A | Discharge status: Home / Self Care | Payer: 59 | Attending: Family Medicine | Admitting: Family Medicine

## 2014-10-07 ENCOUNTER — Encounter: Payer: Self-pay | Admitting: Gastroenterology

## 2014-10-07 ENCOUNTER — Telehealth: Payer: Self-pay | Admitting: Gastroenterology

## 2014-10-07 ENCOUNTER — Encounter (HOSPITAL_COMMUNITY): Payer: Self-pay

## 2014-10-07 VITALS — BP 147/77 | HR 62 | Temp 98.1°F | Resp 22 | Ht 60.0 in | Wt 177.0 lb

## 2014-10-07 DIAGNOSIS — K633 Ulcer of intestine: Secondary | ICD-10-CM | POA: Insufficient documentation

## 2014-10-07 DIAGNOSIS — K219 Gastro-esophageal reflux disease without esophagitis: Secondary | ICD-10-CM | POA: Diagnosis not present

## 2014-10-07 DIAGNOSIS — D125 Benign neoplasm of sigmoid colon: Secondary | ICD-10-CM | POA: Diagnosis not present

## 2014-10-07 DIAGNOSIS — Z6827 Body mass index (BMI) 27.0-27.9, adult: Secondary | ICD-10-CM | POA: Insufficient documentation

## 2014-10-07 DIAGNOSIS — E785 Hyperlipidemia, unspecified: Secondary | ICD-10-CM | POA: Diagnosis not present

## 2014-10-07 DIAGNOSIS — Z86010 Personal history of colonic polyps: Secondary | ICD-10-CM

## 2014-10-07 DIAGNOSIS — I951 Orthostatic hypotension: Secondary | ICD-10-CM | POA: Diagnosis not present

## 2014-10-07 DIAGNOSIS — Z1211 Encounter for screening for malignant neoplasm of colon: Secondary | ICD-10-CM

## 2014-10-07 DIAGNOSIS — D120 Benign neoplasm of cecum: Secondary | ICD-10-CM | POA: Diagnosis not present

## 2014-10-07 DIAGNOSIS — Z886 Allergy status to analgesic agent status: Secondary | ICD-10-CM | POA: Insufficient documentation

## 2014-10-07 DIAGNOSIS — Z7982 Long term (current) use of aspirin: Secondary | ICD-10-CM | POA: Insufficient documentation

## 2014-10-07 DIAGNOSIS — E669 Obesity, unspecified: Secondary | ICD-10-CM | POA: Diagnosis not present

## 2014-10-07 DIAGNOSIS — K625 Hemorrhage of anus and rectum: Secondary | ICD-10-CM | POA: Diagnosis present

## 2014-10-07 DIAGNOSIS — Z9889 Other specified postprocedural states: Secondary | ICD-10-CM

## 2014-10-07 DIAGNOSIS — E119 Type 2 diabetes mellitus without complications: Secondary | ICD-10-CM | POA: Diagnosis not present

## 2014-10-07 DIAGNOSIS — D122 Benign neoplasm of ascending colon: Secondary | ICD-10-CM | POA: Diagnosis not present

## 2014-10-07 DIAGNOSIS — K921 Melena: Principal | ICD-10-CM | POA: Insufficient documentation

## 2014-10-07 DIAGNOSIS — K922 Gastrointestinal hemorrhage, unspecified: Secondary | ICD-10-CM

## 2014-10-07 DIAGNOSIS — K635 Polyp of colon: Secondary | ICD-10-CM | POA: Insufficient documentation

## 2014-10-07 DIAGNOSIS — Z8673 Personal history of transient ischemic attack (TIA), and cerebral infarction without residual deficits: Secondary | ICD-10-CM | POA: Diagnosis not present

## 2014-10-07 DIAGNOSIS — I10 Essential (primary) hypertension: Secondary | ICD-10-CM

## 2014-10-07 DIAGNOSIS — R55 Syncope and collapse: Secondary | ICD-10-CM

## 2014-10-07 DIAGNOSIS — D62 Acute posthemorrhagic anemia: Secondary | ICD-10-CM | POA: Insufficient documentation

## 2014-10-07 DIAGNOSIS — Z860101 Personal history of adenomatous and serrated colon polyps: Secondary | ICD-10-CM

## 2014-10-07 HISTORY — DX: Personal history of adenomatous and serrated colon polyps: Z86.0101

## 2014-10-07 HISTORY — DX: Gastrointestinal hemorrhage, unspecified: K92.2

## 2014-10-07 HISTORY — DX: Personal history of colonic polyps: Z86.010

## 2014-10-07 LAB — COMPREHENSIVE METABOLIC PANEL
ALBUMIN: 3.1 g/dL — AB (ref 3.5–5.0)
ALT: 28 U/L (ref 14–54)
AST: 32 U/L (ref 15–41)
Alkaline Phosphatase: 64 U/L (ref 38–126)
Anion gap: 9 (ref 5–15)
BILIRUBIN TOTAL: 0.4 mg/dL (ref 0.3–1.2)
BUN: 9 mg/dL (ref 6–20)
CALCIUM: 8.3 mg/dL — AB (ref 8.9–10.3)
CHLORIDE: 105 mmol/L (ref 101–111)
CO2: 22 mmol/L (ref 22–32)
CREATININE: 0.74 mg/dL (ref 0.44–1.00)
GFR calc Af Amer: >60 mL/min (ref >60)
GFR calc non Af Amer: >60 mL/min (ref >60)
Glucose, Bld: 124 mg/dL — ABNORMAL HIGH (ref 70–99)
Potassium: 3.6 mmol/L (ref 3.5–5.1)
SODIUM: 136 mmol/L (ref 135–145)
TOTAL PROTEIN: 6.8 g/dL (ref 6.5–8.1)

## 2014-10-07 LAB — CBC
HEMATOCRIT: 35.8 % — AB (ref 36.0–46.0)
Hemoglobin: 11.1 g/dL — ABNORMAL LOW (ref 12.0–15.0)
MCH: 27.1 pg (ref 26.0–34.0)
MCHC: 31 g/dL (ref 30.0–36.0)
MCV: 87.3 fL (ref 78.0–100.0)
PLATELETS: 247 10*3/uL (ref 150–400)
RBC: 4.1 MIL/uL (ref 3.87–5.11)
RDW: 13.9 % (ref 11.5–15.5)
WBC: 8.4 10*3/uL (ref 4.0–10.5)

## 2014-10-07 LAB — ABO/RH: ABO/RH(D): O NEG

## 2014-10-07 LAB — TYPE AND SCREEN
ABO/RH(D): O NEG
ANTIBODY SCREEN: NEGATIVE

## 2014-10-07 LAB — APTT: aPTT: 30 seconds (ref 24–37)

## 2014-10-07 LAB — PROTIME-INR
INR: 1.07 (ref 0.00–1.49)
Prothrombin Time: 14 seconds (ref 11.6–15.2)

## 2014-10-07 MED ORDER — SODIUM CHLORIDE 0.9 % IV SOLN
INTRAVENOUS | Status: DC
  Administered 2014-10-07: 21:00:00 via INTRAVENOUS

## 2014-10-07 MED ORDER — SODIUM CHLORIDE 0.9 % IV BOLUS (SEPSIS)
1000.0000 mL | Freq: Once | INTRAVENOUS | Status: AC
  Administered 2014-10-07: 1000 mL via INTRAVENOUS

## 2014-10-07 MED ORDER — SODIUM CHLORIDE 0.9 % IV SOLN: 500.0000 mL | INTRAVENOUS | Status: DC

## 2014-10-07 MED ORDER — PEG 3350-KCL-NA BICARB-NACL 420 G PO SOLR
4000.0000 mL | Freq: Once | ORAL | Status: AC
  Administered 2014-10-07: 4000 mL via ORAL
  Filled 2014-10-07: qty 4000

## 2014-10-07 NOTE — Progress Notes (Signed)
Called to room to assist during endoscopic procedure.  Patient ID and intended procedure confirmed with present staff. Received instructions for my participation in the procedure from the performing physician.  

## 2014-10-07 NOTE — Progress Notes (Signed)
A/ox3 pleased with MAC, report to Annette RN 

## 2014-10-07 NOTE — ED Provider Notes (Signed)
Patient developed rectal bleeding onset this afternoon after having had a colonoscopy earlier in the day. She admits to lightheadedness with standing she denies other complaint. Denies pain anywhere. On exam alert Glasgow Coma Score 15. Appears comfortable. Gross blood per rectum present, filling bedpan. Patient became lightheaded and had syncopal event while here. She was typed and screened. GI consult hospitalist consult for pain. Patient will require admission . She is likely orthostatic secondary to blood loss.2 peripheral be started and patient admits to fluid bolus.  625 pm pt resting comfortably in bed  gcs 15 ED ECG REPORT   Date: 10/07/2014  Rate: 70  Rhythm: normal sinus rhythm  QRS Axis: normal  Intervals: normal  ST/T Wave abnormalities: nonspecific T wave changes  Conduction Disutrbances:none  Narrative Interpretation:   Old EKG Reviewed: unchanged  I have personally reviewed the EKG tracing and agree with the computerized printout as noted. CRITICAL CARE Performed by: Orlie Dakin Total critical care time:  Critical care time was exclusive of separately billable procedures and treating other patients. Critical care was necessary to treat or prevent imminent or life-threatening deterioration. Critical care was time spent personally by me on the following activities: development of treatment plan with patient and/or surrogate as well as nursing, discussions with consultants, evaluation of patient's response to treatment, examination of patient, obtaining history from patient or surrogate, ordering and performing treatments and interventions, ordering and review of laboratory studies, ordering and review of radiographic studies, pulse oximetry and re-evaluation of patient's condition.   Orlie Dakin, MD 10/07/14 [phone]

## 2014-10-07 NOTE — Progress Notes (Signed)
No problems noted in the recovery room. maw 

## 2014-10-07 NOTE — ED Notes (Addendum)
Pt had a bowel movement with frank red blood present. Tatyana PA made aware.

## 2014-10-07 NOTE — Patient Instructions (Addendum)
YOU HAD AN ENDOSCOPIC PROCEDURE TODAY AT [facility]:   Refer to the procedure report that was given to you for any specific questions about what was found during the examination.  If the procedure report does not answer your questions, please call your gastroenterologist to clarify.  If you requested that your care partner not be given the details of your procedure findings, then the procedure report has been included in a sealed envelope for you to review at your convenience later.  YOU SHOULD EXPECT: Some feelings of bloating in the abdomen. Passage of more gas than usual.  Walking can help get rid of the air that was put into your GI tract during the procedure and reduce the bloating. If you had a lower endoscopy (such as a colonoscopy or flexible sigmoidoscopy) you may notice spotting of blood in your stool or on the toilet paper. If you underwent a bowel prep for your procedure, you may not have a normal bowel movement for a few days.  Please Note:  You might notice some irritation and congestion in your nose or some drainage.  This is from the oxygen used during your procedure.  There is no need for concern and it should clear up in a day or so.  SYMPTOMS TO REPORT IMMEDIATELY:   Following lower endoscopy (colonoscopy or flexible sigmoidoscopy):  Excessive amounts of blood in the stool  Significant tenderness or worsening of abdominal pains  Swelling of the abdomen that is new, acute  Fever of 100F or higher   For urgent or emergent issues, a gastroenterologist can be reached at any hour by calling [phone].   DIET: Your first meal following the procedure should be a small meal and then it is ok to progress to your normal diet. Heavy or fried foods are harder to digest and may make you feel nauseous or bloated.  Likewise, meals heavy in dairy and vegetables can increase bloating.  Drink plenty of fluids but you should avoid alcoholic beverages for 24  hours.  ACTIVITY:  You should plan to take it easy for the rest of today and you should NOT DRIVE or use heavy machinery until tomorrow (because of the sedation medicines used during the test).    FOLLOW UP: Our staff will call the number listed on your records the next business day following your procedure to check on you and address any questions or concerns that you may have regarding the information given to you following your procedure. If we do not reach you, we will leave a message.  However, if you are feeling well and you are not experiencing any problems, there is no need to return our call.  We will assume that you have returned to your regular daily activities without incident.  If any biopsies were taken you will be contacted by phone or by letter within the next 1-3 weeks.  Please call us at [phone] if you have not heard about the biopsies in 3 weeks.    SIGNATURES/CONFIDENTIALITY: You and/or your care partner have signed paperwork which will be entered into your electronic medical record.  These signatures attest to the fact that that the information above on your After Visit Summary has been reviewed and is understood.  Full responsibility of the confidentiality of this discharge information lies with you and/or your care-partner.    Handouts were given to your care partner on polyps. You may resume your current medications today.  Per Dr. Deatra Ina you may  resume your aspirin but hold all other NSAID medications for 2 weeks. Await biopsy results. Please call if any questions or concerns.

## 2014-10-07 NOTE — Telephone Encounter (Signed)
Returned phone call to pt at 16:51 and the wrong number was listed on the phone note.  I received a number for "Peggy".  I checked the snap shot which was a correct number at [phone] home.  Answering machine picked up and a recording to leave a message.  I left message that I wished I could speak with someone re: 3 episodes of rectal bleeding.  I left a message for the pt to call the on call md at [phone] or if still bleeding to go to the emergency room.  I then checked the chart review and saw the pt was in the E.R.  maw

## 2014-10-07 NOTE — H&P (Signed)
PCP:   Philis Fendt, MD   Chief Complaint:  Rectal bleeding  HPI: 64 yo female s/p colonoscopy this am around 9 had polypectomy x 4, was discharged home in good condition.  Than later on mid afternoon she acutely started having several episodes of brbpr.  She has passed out twice while trying to stand, gets very dizzy upon standing and passes out.  She has had at least 6 episodes of bloody stool.  No n/v.  Denies any abdominal pain.  Lying down she feels better and her bp is good lying down.  She is receiving her first liter of ivf bolus.  GI on call has been call and will be seeing her shortly for possible scoping again tonight.  She denies any focal neurological deficits. ,  Review of Systems:  Positive and negative as per HPI otherwise all other systems are negative  Past Medical History: Past Medical History  Diagnosis Date  . Stroke 2007    weakness left side  . Hypertension   . Diabetes     borderline/ no meds  . Obesity   . Hyperlipidemia   . GERD (gastroesophageal reflux disease)    Past Surgical History  Procedure Laterality Date  . No past surgeries      Medications: Prior to Admission medications   Medication Sig Start Date End Date Taking? Authorizing Provider  amLODipine (NORVASC) 5 MG tablet Take 5 mg by mouth daily.   Yes Historical Provider, MD  aspirin EC 81 MG tablet Take 81 mg by mouth daily.   Yes Historical Provider, MD  atorvastatin (LIPITOR) 10 MG tablet Take 10 mg by mouth daily at 6 PM.    Yes Historical Provider, MD  esomeprazole (NEXIUM) 40 MG capsule Take 40 mg by mouth as needed.   Yes Historical Provider, MD  lisinopril (PRINIVIL,ZESTRIL) 10 MG tablet Take 10 mg by mouth daily.   Yes Historical Provider, MD  Multiple Vitamin (MULTIVITAMIN WITH MINERALS) TABS tablet Take 1 tablet by mouth daily.   Yes Historical Provider, MD  albuterol (PROVENTIL HFA;VENTOLIN HFA) 108 (90 BASE) MCG/ACT inhaler Inhale 1-2 puffs into the lungs every 6 (six) hours  as needed for wheezing or shortness of breath. Patient not taking: Reported on 09/09/2014 07/13/13   Julianne Rice, MD    Allergies:   Allergies  Allergen Reactions  . Tramadol Nausea And Vomiting  . Oatmeal Nausea Only    Social History:  reports that she has never smoked. She does not have any smokeless tobacco history on file. She reports that she does not drink alcohol or use illicit drugs.  Family History: Family History  Problem Relation Age of Onset  . Asthma    . Stroke    . Stroke Mother   . Asthma Father   . Hypertension Brother     Physical Exam: Filed Vitals:   10/07/14 1657 10/07/14 1702 10/07/14 1830  BP:  107/58 114/62  Pulse:  69 73  Temp:  98 F (36.7 C)   TempSrc:  Oral   Resp:  23 14  Height:  5\' 4"  (1.626 m)   Weight:  72.576 kg (160 lb)   SpO2: 96% 99% 99%   General appearance: alert, cooperative and no distress Head: Normocephalic, without obvious abnormality, atraumatic Eyes: negative Nose: Nares normal. Septum midline. Mucosa normal. No drainage or sinus tenderness. Neck: no JVD and supple, symmetrical, trachea midline Lungs: clear to auscultation bilaterally Heart: regular rate and rhythm, S1, S2 normal, no murmur, click, rub  or gallop Abdomen: soft, non-tender; bowel sounds normal; no masses,  no organomegaly Extremities: extremities normal, atraumatic, no cyanosis or edema Pulses: 2+ and symmetric Skin: Skin color, texture, turgor normal. No rashes or lesions Neurologic: Grossly normal    Labs on Admission:   Recent Labs  10/07/14 1720  NA 136  K 3.6  CL 105  CO2 22  GLUCOSE 124*  BUN 9  CREATININE 0.74  CALCIUM 8.3*    Recent Labs  10/07/14 1720  AST 32  ALT 28  ALKPHOS 64  BILITOT 0.4  PROT 6.8  ALBUMIN 3.1*    Recent Labs  10/07/14 1720  WBC 8.4  HGB 11.1*  HCT 35.8*  MCV 87.3  PLT 247   Radiological Exams on Admission: No results found.  Assessment/Plan  64 yo female s/p several polypectomy  today now with acute lower GI blood loss likely from recent polypectomy  Principal Problem:   Acute lower GI bleeding-  hgb 11.  bp soft.  GI to see tonight for possible repeat scoping.  Will keep npo and obviously hold all antiplatelet and anticoagulants at this time.  Repeat h/h q 8 hours or sooner if any further change in vitals or bleeding.  obs in stepdown overnight.  Transfuse as needed.  Active Problems: stable unless o/w noted   Hypertension-   Hold bp meds in this acute setting of acute blood loss   Diabetes-  ssi   Hyperlipidemia-  noted   S/P colonoscopic polypectomy multiple today-  noted   Syncope due to orthostatic hypotension-  Noted.  Pt had syncopal episode in ED upon standing to check her orthostatics.    Observe on stepdown.  Full code.  DAVID,RACHAL A 10/07/2014, 7:10 PM

## 2014-10-07 NOTE — ED Notes (Signed)
Pt had a colonoscopy this morning and reports that at 1400 she began to have multiple BM that had large amounts of blood in them.  Pt reports dizziness upon standing and has positive orthostatic vital signs per EMS.

## 2014-10-07 NOTE — Op Note (Signed)
[facility]  [address] [place], [id number]   COLONOSCOPY PROCEDURE REPORT  PATIENT: Erica Cooper, Erica Cooper  MR#: [id number] BIRTHDATE: 09/08/1950 , 41  yrs. old GENDER: female ENDOSCOPIST: Inda Castle, MD REFERRED BY: PROCEDURE DATE:  10/07/2014 PROCEDURE:   Colonoscopy with snare polypectomy, Submucosal injection, any substance, and Colonoscopy, screening First Screening Colonoscopy - Avg.  risk and is 50 yrs.  old or older Yes.  Prior Negative Screening - Now for repeat screening. N/A  History of Adenoma - Now for follow-up colonoscopy & has been > or = to 3 yrs.  N/A  Polyps Removed Today ASA CLASS:   Class II INDICATIONS:Colorectal Neoplasm Risk Assessment for this procedure is average risk. MEDICATIONS: Monitored anesthesia care and Propofol 200 mg IV  DESCRIPTION OF PROCEDURE:   After the risks benefits and alternatives of the procedure were thoroughly explained, informed consent was obtained.  The digital rectal exam revealed no abnormalities of the rectum.   The LB CF-H180AL Loaner [id number] endoscope was introduced through the anus and advanced to the cecum, which was identified by both the appendix and ileocecal valve. No adverse events experienced.   The quality of the prep was (Suprep was used) excellent.  The instrument was then slowly withdrawn as the colon was fully examined.      COLON FINDINGS: A sessile polyp measuring 12 mm in size was found in the ascending colon.  A polypectomy was performed with a cold snare.  The resection was complete, the polyp tissue was completely retrieved and sent to histology.   A sessile polyp measuring 9 mm in size was found at the cecum.  A polypectomy was performed with a cold snare.  The resection was complete, the polyp tissue was completely retrieved and sent to histology.   A sessile polyp measuring 15 mm in size was found at the cecum.  A saline Injection was given to lift the mucosal wall.  A  polypectomy was performed using snare cautery.   A flat polyp measuring 10 mm in size was found in the sigmoid colon.  A polypectomy was performed using snare cautery.  The resection was complete, the polyp tissue was completely retrieved and sent to histology.  Retroflexed views revealed no abnormalities. The time to cecum = 2.9 Withdrawal time = 12.1   The scope was withdrawn and the procedure completed. COMPLICATIONS: There were no immediate complications.  ENDOSCOPIC IMPRESSION: 1.   Sessile polyp was found in the ascending colon; polypectomy was performed with a cold snare 2.   Sessile polyp was found at the cecum; polypectomy was performed with a cold snare 3.   Sessile polyp was found at the cecum; saline was given to lift the mucosal wall.; polypectomy was performed using snare cautery 4.   Flat polyp was found in the sigmoid colon; polypectomy was performed using snare cautery  RECOMMENDATIONS: Colonoscopy  3 years Avoid NSAIDS for 2 weeks  eSigned:  Inda Castle, MD 10/07/2014 9:32 AM   cc: Tillman Sers, MD   PATIENT NAME:  Erica Cooper, Erica Cooper MR#: [id number]

## 2014-10-07 NOTE — Progress Notes (Signed)
Per Dr. Deatra Ina Erica Cooper to continue taking aspirin, but hold all other NSIDA for 2 weeks.   Erica Cooper and her family advised. maw

## 2014-10-07 NOTE — Consult Note (Signed)
Consultation  Referring Provider: Jeannett Senior, PA-C (ED) Primary Care Physician:  Philis Fendt, MD Primary Gastroenterologist:  Dr. Deatra Ina  Reason for Consultation:  hematochezia  HPI: Erica Cooper is a 64 y.o. female who had colonoscopy today with Dr. Deatra Ina with polypectomy who presented to the emergency department after 4 episodes of hematochezia at home. She also had a syncopal event at home when trying to stand. She also has a history of CVA, diabetes, hypertension, hyperlipidemia, obesity and GERD. The postprocedure period was uneventful and she went home and ate 2 pieces of toast, an egg, goat meat and drank hot tea.  Several hours later she had an episode of hematochezia which she describes as bright and dark blood mixed with clots. She then had 2 subsequent bowel movements at home and came to the ED.  Here she has had one episode of hematochezia in the last 3-1/2 hours. She denies abdominal pain. No fever or chills. No nausea or vomiting. When trying to get up to go to the bathroom here she became presyncopal. She has had 2 L of IV fluids, but no blood products.  Her initial Hgb here was 11.1 with base of around 12.7 (Feb 2015).  Plts and INR are normal. She does take aspirin daily but no other antiplatelet or anticoagulant medications.  This was her 1st colonoscopy.  Vital signs are now stable with blood pressure 117/70, pulse is 59, afebrile with normal respirations   Past Medical History  Diagnosis Date  . Stroke 2007    weakness left side  . Hypertension   . Diabetes     borderline/ no meds  . Obesity   . Hyperlipidemia   . GERD (gastroesophageal reflux disease)     Past Surgical History  Procedure Laterality Date  . No past surgeries      Prior to Admission medications   Medication Sig Start Date End Date Taking? Authorizing Provider  amLODipine (NORVASC) 5 MG tablet Take 5 mg by mouth daily.   Yes Historical Provider, MD  aspirin EC 81 MG tablet Take  81 mg by mouth daily.   Yes Historical Provider, MD  atorvastatin (LIPITOR) 10 MG tablet Take 10 mg by mouth daily at 6 PM.    Yes Historical Provider, MD  esomeprazole (NEXIUM) 40 MG capsule Take 40 mg by mouth as needed.   Yes Historical Provider, MD  lisinopril (PRINIVIL,ZESTRIL) 10 MG tablet Take 10 mg by mouth daily.   Yes Historical Provider, MD  Multiple Vitamin (MULTIVITAMIN WITH MINERALS) TABS tablet Take 1 tablet by mouth daily.   Yes Historical Provider, MD  albuterol (PROVENTIL HFA;VENTOLIN HFA) 108 (90 BASE) MCG/ACT inhaler Inhale 1-2 puffs into the lungs every 6 (six) hours as needed for wheezing or shortness of breath. Patient not taking: Reported on 09/09/2014 07/13/13   Julianne Rice, MD    No current facility-administered medications for this encounter.   Current Outpatient Prescriptions  Medication Sig Dispense Refill  . amLODipine (NORVASC) 5 MG tablet Take 5 mg by mouth daily.    Marland Kitchen aspirin EC 81 MG tablet Take 81 mg by mouth daily.    Marland Kitchen atorvastatin (LIPITOR) 10 MG tablet Take 10 mg by mouth daily at 6 PM.     . esomeprazole (NEXIUM) 40 MG capsule Take 40 mg by mouth as needed.    Marland Kitchen lisinopril (PRINIVIL,ZESTRIL) 10 MG tablet Take 10 mg by mouth daily.    . Multiple Vitamin (MULTIVITAMIN WITH MINERALS) TABS tablet Take 1 tablet by mouth  daily.    . albuterol (PROVENTIL HFA;VENTOLIN HFA) 108 (90 BASE) MCG/ACT inhaler Inhale 1-2 puffs into the lungs every 6 (six) hours as needed for wheezing or shortness of breath. (Patient not taking: Reported on 09/09/2014) 1 Inhaler 0   Facility-Administered Medications Ordered in Other Encounters  Medication Dose Route Frequency Provider Last Rate Last Dose  . 0.9 %  sodium chloride infusion  500 mL Intravenous Continuous Inda Castle, MD        Allergies as of 10/07/2014 - Review Complete 10/07/2014  Allergen Reaction Noted  . Tramadol Nausea And Vomiting 07/13/2013  . Oatmeal Nausea Only 10/07/2014    Family History  Problem  Relation Age of Onset  . Asthma    . Stroke    . Stroke Mother   . Asthma Father   . Hypertension Brother     History   Social History  . Marital Status: Married    Spouse Name: N/A  . Number of Children: N/A  . Years of Education: N/A   Occupational History  . Not on file.   Social History Main Topics  . Smoking status: Never Smoker   . Smokeless tobacco: Not on file  . Alcohol Use: No  . Drug Use: No  . Sexual Activity: Not on file   Other Topics Concern  . Not on file   Social History Narrative    Review of Systems: As per HPI, otherwise negative  Physical Exam: Vital signs in last 24 hours: Temp:  [98 F (36.7 C)] 98 F (36.7 C) (05/10 1702) Pulse Rate:  [69-73] 72 (05/10 1918) Resp:  [14-23] 19 (05/10 1918) BP: (104-114)/(57-62) 104/57 mmHg (05/10 1918) SpO2:  [96 %-99 %] 97 % (05/10 1918) Weight:  [160 lb (72.576 kg)] 160 lb (72.576 kg) (05/10 1702)   Gen: awake, alert, NAD HEENT: anicteric, op clear CV: RRR, no mrg Pulm: CTA b/l Abd: soft, obese, NT/ND, +BS throughout Ext: no c/c/e Neuro: nonfocal   Intake/Output from previous day:   Intake/Output this shift:    Lab Results:  Recent Labs  10/07/14 1720  WBC 8.4  HGB 11.1*  HCT 35.8*  PLT 247   BMET  Recent Labs  10/07/14 1720  NA 136  K 3.6  CL 105  CO2 22  GLUCOSE 124*  BUN 9  CREATININE 0.74  CALCIUM 8.3*   LFT  Recent Labs  10/07/14 1720  PROT 6.8  ALBUMIN 3.1*  AST 32  ALT 28  ALKPHOS 64  BILITOT 0.4   PT/INR  Recent Labs  10/07/14 1720  LABPROT 14.0  INR 1.07    Studies/Results: Colonoscopy -- 15 mm cecal polyp resection after saline lift with hot snare.  9 mm cecal polyp resected with cold snare. 12 mm ascending polyp resected with cold snare.  10 mm sigmoid flat polyp resected with hot snare.   No diverticulosis noted   IMPRESSION:  Post-polypectomy hemorrhage with orthostasis now improved with IVF resuscitation  PLAN: --I have discussed  management with her tonight. I expect she will need repeat colonoscopy. Given the location of polypectomy today, specifically in the cecum a full colonoscopy will need to be repeated. Ideally repeat prep can be given to allow for better visualization and better chances of finding and stopping post-polypectomy hemorrhage. --She has not had another episode of hematochezia in approximately 2 hours, vital signs are stable, all indicating slowing or potentially current cessation of bleeding.  Remains at risk of rebleeding --Plan repeat colonoscopy prep tonight, can  stop prep once blood clears from colon --Clear liquids till midnight --maintain 2 PIVs at all times --OOB with assistance only --q6h Hgb, transfuse to maintain Hgb >8 g/dL --if active rebleeding or should she become unstable before colonoscopy tomorrow, will plan repeat colonoscopy at bedside tonight --hold asa --plan discussed with pt and son.The nature of the repeat colonoscopy, as well as the risks, benefits, and alternatives were carefully and thoroughly reviewed with the patient. Ample time for discussion and questions allowed. The patient understood, was satisfied, and agreed to proceed.      Jerene Bears  10/07/2014, 8:29 PM

## 2014-10-07 NOTE — ED Provider Notes (Signed)
CSN: [id number]     Arrival date & time 10/07/14  1651 History   First MD Initiated Contact with Patient 10/07/14 1708     Chief Complaint  Patient presents with  . GI Bleeding     (Consider location/radiation/quality/duration/timing/severity/associated sxs/prior Treatment) HPI Erica Cooper is a 64 y.o. female with hx of cva, htn, DM, presents to ED with complaint of rectal bleeding. Pt had colonoscopy this morning and had 3 polyps removed by Dr. Deatra Ina. Pt states she went home, napped, after she woke up, she had 4 bowel movements with bright red blood and clots. Pt states she if feeling dizzy and lightheaded. Pt denies abdominal pain. No associated complaints. States she actually had no stool in commode, just blood. Reports she is on aspirin, took last dose this morning.   Past Medical History  Diagnosis Date  . Stroke 2007    weakness left side  . Hypertension   . Diabetes     borderline/ no meds  . Obesity   . Hyperlipidemia   . GERD (gastroesophageal reflux disease)    Past Surgical History  Procedure Laterality Date  . No past surgeries     Family History  Problem Relation Age of Onset  . Asthma    . Stroke    . Stroke Mother   . Asthma Father   . Hypertension Brother    History  Substance Use Topics  . Smoking status: Never Smoker   . Smokeless tobacco: Not on file  . Alcohol Use: No   OB History    No data available     Review of Systems  Constitutional: Negative for fever and chills.  Respiratory: Negative for cough, chest tightness and shortness of breath.   Cardiovascular: Negative for chest pain, palpitations and leg swelling.  Gastrointestinal: Positive for blood in stool. Negative for nausea, vomiting, abdominal pain and diarrhea.  Genitourinary: Negative for dysuria and flank pain.  Musculoskeletal: Negative for myalgias, arthralgias, neck pain and neck stiffness.  Skin: Negative for rash.  Neurological: Negative for dizziness, weakness and  headaches.  All other systems reviewed and are negative.      Allergies  Tramadol and Oatmeal  Home Medications   Prior to Admission medications   Medication Sig Start Date End Date Taking? Authorizing Provider  amLODipine (NORVASC) 5 MG tablet Take 5 mg by mouth daily.   Yes Historical Provider, MD  aspirin EC 81 MG tablet Take 81 mg by mouth daily.   Yes Historical Provider, MD  atorvastatin (LIPITOR) 10 MG tablet Take 10 mg by mouth daily at 6 PM.    Yes Historical Provider, MD  esomeprazole (NEXIUM) 40 MG capsule Take 40 mg by mouth as needed.   Yes Historical Provider, MD  lisinopril (PRINIVIL,ZESTRIL) 10 MG tablet Take 10 mg by mouth daily.   Yes Historical Provider, MD  Multiple Vitamin (MULTIVITAMIN WITH MINERALS) TABS tablet Take 1 tablet by mouth daily.   Yes Historical Provider, MD  albuterol (PROVENTIL HFA;VENTOLIN HFA) 108 (90 BASE) MCG/ACT inhaler Inhale 1-2 puffs into the lungs every 6 (six) hours as needed for wheezing or shortness of breath. Patient not taking: Reported on 09/09/2014 07/13/13   Julianne Rice, MD   BP 107/58 mmHg  Pulse 69  Temp(Src) 98 F (36.7 C) (Oral)  Resp 23  Ht 5\' 4"  (1.626 m)  Wt 160 lb (72.576 kg)  BMI 27.45 kg/m2  SpO2 99% Physical Exam  Constitutional: She is oriented to person, place, and time. She appears well-developed  and well-nourished. No distress.  HENT:  Head: Normocephalic.  Eyes: Conjunctivae are normal.  Neck: Neck supple.  Cardiovascular: Normal rate, regular rhythm and normal heart sounds.   Pulmonary/Chest: Effort normal and breath sounds normal. No respiratory distress. She has no wheezes. She has no rales.  Abdominal: Soft. Bowel sounds are normal. She exhibits no distension. There is no tenderness. There is no rebound and no guarding.  Musculoskeletal: She exhibits no edema.  Neurological: She is alert and oriented to person, place, and time.  Skin: Skin is warm and dry.  Psychiatric: She has a normal mood and  affect. Her behavior is normal.  Nursing note and vitals reviewed.   ED Course  Procedures (including critical care time) Labs Review Labs Reviewed  CBC - Abnormal; Notable for the following:    Hemoglobin 11.1 (*)    HCT 35.8 (*)    All other components within normal limits  COMPREHENSIVE METABOLIC PANEL - Abnormal; Notable for the following:    Glucose, Bld 124 (*)    Calcium 8.3 (*)    Albumin 3.1 (*)    All other components within normal limits  PROTIME-INR  APTT  BASIC METABOLIC PANEL  HEMOGLOBIN AND HEMATOCRIT, BLOOD  HEMOGLOBIN AND HEMATOCRIT, BLOOD  TYPE AND SCREEN  ABO/RH    Imaging Review No results found.   EKG Interpretation None      MDM   Final diagnoses:  Rectal bleed  Syncope, unspecified syncope type    patient seen and examined. Patient with large amount of blood in toilet since having colonoscopy this morning. Patient denies any abdominal pain. At this time I'm not concerned about perforation, most likely bleeding from removed polyps. Patient feels very dizzy, check orthostatics, blood work and type and screen ordered.  5:53 PM Patient had a syncopal episode while they were performing orthostatic vitals. Patient was caught and helped back to bed. She was alert and oriented right after the syncopal episode and examined by me. Her orthostatic vital signs were unremarkable, however her blood pressure after syncope was 88/50. Patient receiving IV fluids at this time lab work is pending.  6:13 PM Discussed with Dr. Hilarie Fredrickson, will come by see pt. Advised to get admmitted to medicine.   Discussed with [facility], will admit.   9:21 PM BP remaining stable.   Jeannett Senior, PA-C 10/07/14 1813  Jeannett Senior, PA-C 10/07/14 2121  Orlie Dakin, MD 10/08/14 Laureen Abrahams

## 2014-10-07 NOTE — ED Notes (Signed)
Pt placed on bedpan

## 2014-10-08 ENCOUNTER — Ambulatory Visit: Payer: 59

## 2014-10-08 ENCOUNTER — Encounter (HOSPITAL_COMMUNITY)
Admission: EM | Disposition: A | Discharge status: Home / Self Care | Payer: Self-pay | Source: Home / Self Care | Attending: Emergency Medicine

## 2014-10-08 ENCOUNTER — Ambulatory Visit: Payer: 59 | Admitting: Occupational Therapy

## 2014-10-08 ENCOUNTER — Telehealth: Payer: Self-pay | Admitting: *Deleted

## 2014-10-08 ENCOUNTER — Encounter: Payer: Self-pay | Admitting: Internal Medicine

## 2014-10-08 DIAGNOSIS — K922 Gastrointestinal hemorrhage, unspecified: Secondary | ICD-10-CM | POA: Diagnosis not present

## 2014-10-08 HISTORY — PX: COLONOSCOPY: SHX5424

## 2014-10-08 LAB — HEMOGLOBIN AND HEMATOCRIT, BLOOD
HCT: 29 % — ABNORMAL LOW (ref 36.0–46.0)
HCT: 28.4 % — ABNORMAL LOW (ref 36.0–46.0)
HEMATOCRIT: 29.6 % — AB (ref 36.0–46.0)
HEMOGLOBIN: 9.1 g/dL — AB (ref 12.0–15.0)
HEMOGLOBIN: 9.1 g/dL — AB (ref 12.0–15.0)
Hemoglobin: 8.8 g/dL — ABNORMAL LOW (ref 12.0–15.0)

## 2014-10-08 LAB — BASIC METABOLIC PANEL
Anion gap: 11 (ref 5–15)
BUN: <5 mg/dL — ABNORMAL LOW (ref 6–20)
CO2: 21 mmol/L — ABNORMAL LOW (ref 22–32)
Calcium: 8.1 mg/dL — ABNORMAL LOW (ref 8.9–10.3)
Chloride: 108 mmol/L (ref 101–111)
Creatinine, Ser: 0.66 mg/dL (ref 0.44–1.00)
GFR calc Af Amer: >60 mL/min (ref >60)
Glucose, Bld: 100 mg/dL — ABNORMAL HIGH (ref 70–99)
Potassium: 3.5 mmol/L (ref 3.5–5.1)
Sodium: 140 mmol/L (ref 135–145)

## 2014-10-08 SURGERY — COLONOSCOPY
Anesthesia: Moderate Sedation

## 2014-10-08 MED ORDER — FENTANYL CITRATE (PF) 100 MCG/2ML IJ SOLN
INTRAMUSCULAR | Status: AC
  Filled 2014-10-08: qty 2

## 2014-10-08 MED ORDER — MIDAZOLAM HCL 5 MG/5ML IJ SOLN
INTRAMUSCULAR | Status: DC | PRN
  Administered 2014-10-08: 1 mg via INTRAVENOUS
  Administered 2014-10-08 (×2): 2 mg via INTRAVENOUS

## 2014-10-08 MED ORDER — DIPHENHYDRAMINE HCL 50 MG/ML IJ SOLN
INTRAMUSCULAR | Status: AC
  Filled 2014-10-08: qty 1

## 2014-10-08 MED ORDER — MIDAZOLAM HCL 5 MG/ML IJ SOLN
INTRAMUSCULAR | Status: AC
  Filled 2014-10-08: qty 2

## 2014-10-08 MED ORDER — SODIUM CHLORIDE 0.9 % IV SOLN: INTRAVENOUS | Status: DC

## 2014-10-08 MED ORDER — FENTANYL CITRATE (PF) 100 MCG/2ML IJ SOLN
INTRAMUSCULAR | Status: DC | PRN
Start: 2014-10-08 — End: 2014-10-08
  Administered 2014-10-08 (×2): 25 ug via INTRAVENOUS

## 2014-10-08 NOTE — Progress Notes (Signed)
   Erica Cooper VCB:[id number] DOB: May 24, 1951 DOA: 10/07/2014 PCP: Philis Fendt, MD  Brief narrative: 64 yo female s/p colonoscopy 5/10~ 9:00 am had polypectomy x 4, was discharged home in good condition. Than later on mid afternoon she acutely started having several episodes of brbpr. She has passed out twice while trying to stand, gets very dizzy upon standing and passes out  Past medical history-As per Problem list Chart reviewed as below- GI  Consultants:  GI  Procedures:  Colonoscopy rpt 5/11  Antibiotics:   none   Subjective  Fair slighyt bloody stool after colonoscopy No n/v NO emesis No fever Feels a little bloated No cp   Objective    Interim History:   Telemetry:    Objective: Filed Vitals:   10/08/14 1415 10/08/14 1430 10/08/14 1500 10/08/14 1636  BP: 113/53 124/63 127/66 116/52  Pulse: 83 93 73 75  Temp:   98.2 F (36.8 C) 98.4 F (36.9 C)  TempSrc:   Oral Oral  Resp: 18 19 18 16   Height:      Weight:      SpO2: 93% 98% 100% 99%    Intake/Output Summary (Last 24 hours) at 10/08/14 1824 Last data filed at 10/08/14 1021  Gross per 24 hour  Intake      0 ml  Output   1400 ml  Net  -1400 ml    Exam:  General: eomi ncat Cardiovascular: s1 s 2no m/r/g Respiratory: clear Abdomen: soft nt nd  Skin no le edema  Data Reviewed: Basic Metabolic Panel:  Recent Labs Lab 10/07/14 1720 10/08/14 0839  NA 136 140  K 3.6 3.5  CL 105 108  CO2 22 21*  GLUCOSE 124* 100*  BUN 9 <5*  CREATININE 0.74 0.66  CALCIUM 8.3* 8.1*   Liver Function Tests:  Recent Labs Lab 10/07/14 1720  AST 32  ALT 28  ALKPHOS 64  BILITOT 0.4  PROT 6.8  ALBUMIN 3.1*   No results for input(s): LIPASE, AMYLASE in the last 168 hours. No results for input(s): AMMONIA in the last 168 hours. CBC:  Recent Labs Lab 10/07/14 1720 10/08/14 0435 10/08/14 0839 10/08/14 1656  WBC 8.4  --   --   --   HGB 11.1* 9.1* 9.1* 8.8*  HCT 35.8* 29.0* 29.6*  28.4*  MCV 87.3  --   --   --   PLT 247  --   --   --    Cardiac Enzymes: No results for input(s): CKTOTAL, CKMB, CKMBINDEX, TROPONINI in the last 168 hours. BNP: Invalid input(s): POCBNP CBG: No results for input(s): GLUCAP in the last 168 hours.  No results found for this or any previous visit (from the past 240 hour(s)).   Studies:              All Imaging reviewed and is as per above notation   Scheduled Meds:  Continuous Infusions: . sodium chloride 75 mL/hr at 10/08/14 0859     Assessment/Plan: 1. Lower GI bleed-Colonoscopy showed some oozing.  Appreciate GI input.  Rpt cbc am.  contuinue clear.  Hb has dropped to 9.  Defer when to re-start duiet etc to GI 2. anemia acute blood loss-see above 3. DM-continue SSI 4. Syncope 2/2 to ABLA  Code Status: d/w daughter bedside Family Communication: discussed with realtive bedside who understands  Disposition Plan: ? Home tom   Verneita Griffes, MD  [facility] Pager [phone] 10/08/2014, 6:24 PM

## 2014-10-08 NOTE — ED Notes (Signed)
Pt has approx 246ml of clear liquid stool one small blood clot noted. Pt states that she does not feel the urge to have a bowel movement like she did earlier in the night.

## 2014-10-08 NOTE — ED Notes (Addendum)
Pt had large liquid stool, frank blood noted.

## 2014-10-08 NOTE — Care Management (Signed)
Consult from admission nurse for medication assistance. Patient has insurance , unable to assist further . Magdalen Spatz RN BSN

## 2014-10-08 NOTE — ED Notes (Signed)
This RN spoke with admitting MD and updated him regarding pts status and that pt became dizzy when orthostatics were attempted prior to my shift starting. Pt vitals remain stable and rectal bleeding has not been noted since 0700. Will continue to monitor and update MD once hemoglobin is redrawn.

## 2014-10-08 NOTE — ED Notes (Signed)
Paged admitting Dr to Lexine Baton, [place]

## 2014-10-08 NOTE — Telephone Encounter (Signed)
  Follow up Call-  Call back number 10/07/2014  Post procedure Call Back phone  # [phone]  Permission to leave phone message Yes    [facility]

## 2014-10-08 NOTE — ED Notes (Signed)
Meal Tray ordered.  

## 2014-10-08 NOTE — ED Notes (Signed)
Admitting MD updated on pt status. VSS and hgb reported. Will call and update after colonoscopy is complete

## 2014-10-08 NOTE — ED Notes (Signed)
Endo at bedside setting up for colonoscopy.

## 2014-10-08 NOTE — ED Notes (Signed)
Spoke with Admitting MD advised we are waiting on Medical beds.

## 2014-10-08 NOTE — Op Note (Signed)
[facility] [place], [id number]   COLONOSCOPY PROCEDURE REPORT  PATIENT: Erica Cooper, Erica Cooper  MR#: [id number] BIRTHDATE: 1951-03-30 , 76  yrs. old GENDER: female ENDOSCOPIST: Gatha Mayer, MD, [facility] PROCEDURE DATE:  10/08/2014 PROCEDURE:   Colonoscopy, diagnostic and Colonoscopy with control of bleeding First Screening Colonoscopy - Avg.  risk and is 50 yrs.  old or older - No.  Prior Negative Screening - Now for repeat screening. N/A  History of Adenoma - Now for follow-up colonoscopy & has been > or = to 3 yrs.  N/A  Polyps Removed Today ASA CLASS:   Class III INDICATIONS:Treatment of bleeding from such lesions as vascular malformation, ulceration, neoplasia, & polypectomy site and Patient is not applicable for Colorectal Neoplasm Risk Assessment for this procedure. MEDICATIONS: Fentanyl 50 mcg IV and Versed 5 mg IV  DESCRIPTION OF PROCEDURE:   After the risks benefits and alternatives of the procedure were thoroughly explained, informed consent was obtained.  The digital rectal exam revealed no abnormalities of the rectum.   The Pentax Adult Colon [id number] endoscope was introduced through the anus and advanced to the cecum, which was identified by both the appendix and ileocecal valve. No adverse events experienced.   The quality of the prep was excellent.  (Nulytley was used)  The instrument was then slowly withdrawn as the colon was fully examined.   COLON FINDINGS: 1) cecal polypectomy sites with ulceration on one and adherent clots on both.  No active bleeding but scant fresh blood seen just outside cecum.  Instinct and Resolution clips applied to larger site and 1 resolution clip to smaller site. 2) 59mm transverse polyp seen and removed with forceps. 3) Otherwise normal.  Retroflexed views revealed no abnormalities. The time to cecum = 2.4 Withdrawal time = 11.8   The scope was withdrawn and the procedure completed. COMPLICATIONS:  There were no immediate complications.  ENDOSCOPIC IMPRESSION: 1) cecal polypectomy sites with ulceration on one and adherent clots on both.  No active bleeding but scant fresh blood seen just outside cecum.  Instinct and resolution clips applied to prevent further bleeding. 2) 3 mm transverse polyp removed 3) Otherwise normal exam, excellent prep  RECOMMENDATIONS: Observe overnight if stable home tomorrow  eSigned:  Gatha Mayer, MD, [facility] 10/08/2014 12:23 PM

## 2014-10-09 ENCOUNTER — Encounter: Payer: Self-pay | Admitting: Family Medicine

## 2014-10-09 ENCOUNTER — Encounter: Payer: Self-pay | Admitting: Physician Assistant

## 2014-10-09 ENCOUNTER — Encounter (HOSPITAL_COMMUNITY): Payer: Self-pay | Admitting: Internal Medicine

## 2014-10-09 DIAGNOSIS — K922 Gastrointestinal hemorrhage, unspecified: Secondary | ICD-10-CM | POA: Diagnosis not present

## 2014-10-09 LAB — CBC WITH DIFFERENTIAL/PLATELET
Basophils Absolute: 0 10*3/uL (ref 0.0–0.1)
Basophils Absolute: 0 10*3/uL (ref 0.0–0.1)
Basophils Relative: 0 % (ref 0–1)
Basophils Relative: 0 % (ref 0–1)
EOS ABS: 0.1 10*3/uL (ref 0.0–0.7)
EOS PCT: 2 % (ref 0–5)
EOS PCT: 2 % (ref 0–5)
Eosinophils Absolute: 0.1 10*3/uL (ref 0.0–0.7)
HCT: 25.5 % — ABNORMAL LOW (ref 36.0–46.0)
HCT: 28.2 % — ABNORMAL LOW (ref 36.0–46.0)
Hemoglobin: 8 g/dL — ABNORMAL LOW (ref 12.0–15.0)
Hemoglobin: 8.8 g/dL — ABNORMAL LOW (ref 12.0–15.0)
LYMPHS ABS: 2.6 10*3/uL (ref 0.7–4.0)
LYMPHS PCT: 44 % (ref 12–46)
Lymphocytes Relative: 37 % (ref 12–46)
Lymphs Abs: 2.7 10*3/uL (ref 0.7–4.0)
MCH: 27.3 pg (ref 26.0–34.0)
MCH: 27 pg (ref 26.0–34.0)
MCHC: 31.4 g/dL (ref 30.0–36.0)
MCHC: 31.2 g/dL (ref 30.0–36.0)
MCV: 87 fL (ref 78.0–100.0)
MCV: 86.5 fL (ref 78.0–100.0)
MONO ABS: 0.5 10*3/uL (ref 0.1–1.0)
MONOS PCT: 8 % (ref 3–12)
Monocytes Absolute: 0.5 10*3/uL (ref 0.1–1.0)
Monocytes Relative: 6 % (ref 3–12)
Neutro Abs: 2.7 10*3/uL (ref 1.7–7.7)
Neutro Abs: 3.9 10*3/uL (ref 1.7–7.7)
Neutrophils Relative %: 46 % (ref 43–77)
Neutrophils Relative %: 55 % (ref 43–77)
PLATELETS: 253 10*3/uL (ref 150–400)
Platelets: 227 10*3/uL (ref 150–400)
RBC: 2.93 MIL/uL — AB (ref 3.87–5.11)
RBC: 3.26 MIL/uL — ABNORMAL LOW (ref 3.87–5.11)
RDW: 14.1 % (ref 11.5–15.5)
RDW: 14 % (ref 11.5–15.5)
WBC: 5.8 10*3/uL (ref 4.0–10.5)
WBC: 7.2 10*3/uL (ref 4.0–10.5)

## 2014-10-09 NOTE — Progress Notes (Signed)
   Patient Name: Erica Cooper Date of Encounter: 10/09/2014, 9:28 AM    Subjective  Feeling better - standing, walking ok Had slight BRBPR yesterday x 2 - no stools today   Objective  BP 122/68 mmHg  Pulse 68  Temp(Src) 98.1 F (36.7 C) (Oral)  Resp 16  Ht 5\' 4"  (1.626 m)  Wt 158 lb 6.6 oz (71.856 kg)  BMI 27.18 kg/m2  SpO2 95% Rectal w/ female staff present - loose brown stool  CBC Latest Ref Rng 10/09/2014 10/08/2014 10/08/2014  WBC 4.0 - 10.5 K/uL 5.8 - -  Hemoglobin 12.0 - 15.0 g/dL 8.0(L) 8.8(L) 9.1(L)  Hematocrit 36.0 - 46.0 % 25.5(L) 28.4(L) 29.6(L)  Platelets 150 - 400 K/uL 227 - -      Assessment and Plan  Post-polypectomy GI hemorrhage  Better  I think Hgb is re-equilibration  Getting another  Should be able to go w/o blood today I suspect  Has f/u US Mon 5/16 2PM Amy Bingham PA-C for recheck  May need to hold amlodipine until then - think so if you agree  FeSo4 1 /day also  Reviewed w/ family and pt    Thanks  Gatha Mayer, MD, [facility] [phone] (pager) 10/09/2014 9:28 AM

## 2014-10-09 NOTE — Discharge Summary (Signed)
Physician Discharge Summary  Erica Cooper YSA:[id number] DOB: 07-04-1950 DOA: 10/07/2014  PCP: Philis Fendt, MD  Admit date: 10/07/2014 Discharge date: 10/09/2014  Time spent: 20 minutes  Recommendations for Outpatient Follow-up:  1. No aspirin for 2 weeks 2.  follow with GI soon 3. continue usual home meds  Discharge Diagnoses:  Principal Problem:   Acute lower GI bleeding Active Problems:   Hypertension   Diabetes   Hyperlipidemia   S/P colonoscopic polypectomy   Syncope due to orthostatic hypotension   GI bleed   Discharge Condition: stable  Diet recommendation: hh losw salt  Filed Weights   10/07/14 1702 10/09/14 0648  Weight: 72.576 kg (160 lb) 71.856 kg (158 lb 6.6 oz)    History of present illness:  64 yo female s/p colonoscopy 5/10~ 9:00 am had polypectomy x 4, was discharged home in good condition. Than later on mid afternoon she acutely started having several episodes of brbpr. She has passed out twice while trying to stand, gets very dizzy upon standing and passes out She was observed and eventual colonoscopy by GI Md Carlean Purl was repeated and clips were placed She had minimal dark stool, felt less dizzy and ambulated well and was d/c home in stable state  Procedures: ENDOSCOPIC IMPRESSION Dr. Carlean Purl 10/08/14 1) cecal polypectomy sites with ulceration on one and adherent clots on both. No active bleeding but scant fresh blood seen just outside cecum. Instinct and resolution clips applied to prevent further bleeding. 2) 3 mm transverse polyp removed 3) Otherwise normal exam, excellent prep   Discharge Exam: Filed Vitals:   10/09/14 0648  BP:   Pulse:   Temp: 98.1 F (36.7 C)  Resp: 16    General: eomi, ncat Cardiovascular: s1 s 2no m/r/g Respiratory: clear  Discharge Instructions   Discharge Instructions    Diet - low sodium heart healthy    Complete by:  As directed      Discharge instructions    Complete by:  As directed   No  aspirin for 2 weeks then talk talk to your PMD about resuming     Increase activity slowly    Complete by:  As directed           Current Discharge Medication List    CONTINUE these medications which have NOT CHANGED   Details  amLODipine (NORVASC) 5 MG tablet Take 5 mg by mouth daily.    atorvastatin (LIPITOR) 10 MG tablet Take 10 mg by mouth daily at 6 PM.     esomeprazole (NEXIUM) 40 MG capsule Take 40 mg by mouth as needed.    lisinopril (PRINIVIL,ZESTRIL) 10 MG tablet Take 10 mg by mouth daily.    Multiple Vitamin (MULTIVITAMIN WITH MINERALS) TABS tablet Take 1 tablet by mouth daily.    albuterol (PROVENTIL HFA;VENTOLIN HFA) 108 (90 BASE) MCG/ACT inhaler Inhale 1-2 puffs into the lungs every 6 (six) hours as needed for wheezing or shortness of breath. Qty: 1 Inhaler, Refills: 0      STOP taking these medications     aspirin EC 81 MG tablet        Allergies  Allergen Reactions  . Tramadol Nausea And Vomiting  . Oatmeal Nausea Only   Follow-up Information    Follow up with Nicoletta Ba, PA-C On 10/13/2014.   Specialty:  Gastroenterology   Why:  2PM (Dr. Kelby Fam PA)   Contact information:   [place] [address] [phone]        The results  of significant diagnostics from this hospitalization (including imaging, microbiology, ancillary and laboratory) are listed below for reference.    Significant Diagnostic Studies: No results found.  Microbiology: No results found for this or any previous visit (from the past 240 hour(s)).   Labs: Basic Metabolic Panel:  Recent Labs Lab 10/07/14 1720 10/08/14 0839  NA 136 140  K 3.6 3.5  CL 105 108  CO2 22 21*  GLUCOSE 124* 100*  BUN 9 <5*  CREATININE 0.74 0.66  CALCIUM 8.3* 8.1*   Liver Function Tests:  Recent Labs Lab 10/07/14 1720  AST 32  ALT 28  ALKPHOS 64  BILITOT 0.4  PROT 6.8  ALBUMIN 3.1*   No results for input(s): LIPASE, AMYLASE in the last 168 hours. No results for  input(s): AMMONIA in the last 168 hours. CBC:  Recent Labs Lab 10/07/14 1720 10/08/14 0435 10/08/14 0839 10/08/14 1656 10/09/14 0355 10/09/14 1223  WBC 8.4  --   --   --  5.8 7.2  NEUTROABS  --   --   --   --  2.7 3.9  HGB 11.1* 9.1* 9.1* 8.8* 8.0* 8.8*  HCT 35.8* 29.0* 29.6* 28.4* 25.5* 28.2*  MCV 87.3  --   --   --  87.0 86.5  PLT 247  --   --   --  227 253   Cardiac Enzymes: No results for input(s): CKTOTAL, CKMB, CKMBINDEX, TROPONINI in the last 168 hours. BNP: BNP (last 3 results) No results for input(s): BNP in the last 8760 hours.  ProBNP (last 3 results) No results for input(s): PROBNP in the last 8760 hours.  CBG: No results for input(s): GLUCAP in the last 168 hours.     SignedNita Sells  [facility] 10/09/2014, 1:24 PM

## 2014-10-09 NOTE — Discharge Planning (Signed)
Patient discharged home in stable condition. Verbalizes understanding of all discharge instructions, including home medications and follow up appointments. 

## 2014-10-10 ENCOUNTER — Ambulatory Visit: Payer: 59

## 2014-10-10 NOTE — Progress Notes (Signed)
Quick Note:  Polyp removed was lymphoid polyp and not precancerous Can hear about this on 5/16 f/u visit with Amy Esterwood ______

## 2014-10-13 ENCOUNTER — Encounter: Payer: Self-pay | Admitting: Physician Assistant

## 2014-10-13 ENCOUNTER — Ambulatory Visit (INDEPENDENT_AMBULATORY_CARE_PROVIDER_SITE_OTHER): Payer: 59 | Admitting: Physician Assistant

## 2014-10-13 ENCOUNTER — Other Ambulatory Visit (INDEPENDENT_AMBULATORY_CARE_PROVIDER_SITE_OTHER): Payer: 59

## 2014-10-13 ENCOUNTER — Encounter: Payer: Self-pay | Admitting: Gastroenterology

## 2014-10-13 VITALS — BP 134/72 | HR 80 | Ht 61.0 in | Wt 175.1 lb

## 2014-10-13 DIAGNOSIS — Z9889 Other specified postprocedural states: Secondary | ICD-10-CM

## 2014-10-13 DIAGNOSIS — D62 Acute posthemorrhagic anemia: Secondary | ICD-10-CM

## 2014-10-13 LAB — CBC WITH DIFFERENTIAL/PLATELET
BASOS ABS: 0 10*3/uL (ref 0.0–0.1)
Basophils Relative: 0.6 % (ref 0.0–3.0)
Eosinophils Absolute: 0.1 10*3/uL (ref 0.0–0.7)
Eosinophils Relative: 1.5 % (ref 0.0–5.0)
HCT: 29 % — ABNORMAL LOW (ref 36.0–46.0)
Hemoglobin: 9.3 g/dL — ABNORMAL LOW (ref 12.0–15.0)
Lymphocytes Relative: 37.5 % (ref 12.0–46.0)
Lymphs Abs: 2.7 10*3/uL (ref 0.7–4.0)
MCHC: 32 g/dL (ref 30.0–36.0)
MCV: 86 fl (ref 78.0–100.0)
MONO ABS: 0.7 10*3/uL (ref 0.1–1.0)
Monocytes Relative: 10.1 % (ref 3.0–12.0)
NEUTROS PCT: 50.3 % (ref 43.0–77.0)
Neutro Abs: 3.6 10*3/uL (ref 1.4–7.7)
PLATELETS: 363 10*3/uL (ref 150.0–400.0)
RBC: 3.38 Mil/uL — ABNORMAL LOW (ref 3.87–5.11)
RDW: 15.8 % — ABNORMAL HIGH (ref 11.5–15.5)
WBC: 7.1 10*3/uL (ref 4.0–10.5)

## 2014-10-13 NOTE — Progress Notes (Signed)
Patient ID: Erica Cooper, female   DOB: 1950-08-30, 64 y.o.   MRN: [id number]   Subjective:    Patient ID: Erica Cooper, female    DOB: [birth year], 64 y.o.   MRN: [id number]  HPI Erica Cooper is a 64 year old African-American female known to Dr. Deatra Ina who had undergone colonoscopy in the [facility] on 10/07/2014 and had 4 colon polyps removed the largest in the cecum was 12 mm. Unfortunately she had a GI bleed and required admission on 10/08/2014. This was secondary to post-polypectomy bleed from the cecal polyp site. She required second colonoscopy per Dr. Carlean Purl with endo-clipping of an ulcer at the cecal site. No had a 3 mm transverse colon polyp removed at that time. Biopsies from her for colon polyps were all tubular adenomas, and biopsy from the 3 mm transverse colon polyp at second colonoscopy was lymphoid tissue. She is slated for 3 year interval follow-up. Patient had a hemoglobin low at 8 during her hospitalization in hemoglobin was 8.8 on discharge 10/09/2014. Patient comes in today stating she still feels fatigued but otherwise has been fine. She has no complaints of abdominal pain, she has been having bowel movements no melena or hematochezia.  Review of Systems Pertinent positive and negative review of systems were noted in the above HPI section.  All other review of systems was otherwise negative.  Outpatient Encounter Prescriptions as of 10/13/2014  Medication Sig  . amLODipine (NORVASC) 5 MG tablet Take 5 mg by mouth daily.  Marland Kitchen atorvastatin (LIPITOR) 10 MG tablet Take 10 mg by mouth daily at 6 PM.   . esomeprazole (NEXIUM) 40 MG capsule Take 40 mg by mouth as needed.  Marland Kitchen lisinopril (PRINIVIL,ZESTRIL) 10 MG tablet Take 10 mg by mouth daily.  . Multiple Vitamin (MULTIVITAMIN WITH MINERALS) TABS tablet Take 1 tablet by mouth daily.  . [DISCONTINUED] albuterol (PROVENTIL HFA;VENTOLIN HFA) 108 (90 BASE) MCG/ACT inhaler Inhale 1-2 puffs into the lungs every 6 (six) hours as needed for wheezing or  shortness of breath. (Patient not taking: Reported on 09/09/2014)   No facility-administered encounter medications on file as of 10/13/2014.   Allergies  Allergen Reactions  . Tramadol Nausea And Vomiting  . Oatmeal Nausea Only   Patient Active Problem List   Diagnosis Date Noted  . GI bleed 10/08/2014  . Rectal bleeding 10/07/2014  . Acute lower GI bleeding 10/07/2014  . S/P colonoscopic polypectomy 10/07/2014  . Syncope due to orthostatic hypotension 10/07/2014  . Dyspnea 08/27/2013  . Abnormal ECG 08/27/2013  . Stroke   . Hypertension   . Diabetes   . Obesity   . Hyperlipidemia    History   Social History  . Marital Status: Married    Spouse Name: N/A  . Number of Children: 5  . Years of Education: N/A   Occupational History  . retired    Social History Main Topics  . Smoking status: Never Smoker   . Smokeless tobacco: Never Used  . Alcohol Use: No  . Drug Use: No  . Sexual Activity: Not on file   Other Topics Concern  . Not on file   Social History Narrative    [name]'s family history includes Asthma in her father; Hypertension in her brother; Stroke in her mother.      Objective:    Filed Vitals:   10/13/14 1413  BP: 134/72  Pulse: 80    Physical Exam  well-developed older African-American female in no acute distress, accompanied by her son and husband. Blood  pressure 134/72 pulse 80 height 5 foot 1 weight 175. HEENT; nontraumatic normocephalic EOMI PERRLA sclera anicteric, Neck ;supple no JVD, Cardiovascular; regular rate and rhythm with S1-S2 no murmur or gallop, Pulmonary; clear bilaterally, Abdomen; often nondistended nontender bowel sounds are active there is no palpable mass or hepatosplenomegaly, Rectal ;exam not done, Neuropsych; mood and affect appropriate       Assessment & Plan:   #1 64 yo female s/p acute post polypectomy bleed from ulcer at cecal polypectomy site. #2 4 adenomatous colon polyps #3 anemia secondary to acute blood  loss #4 HTN #5 AODM  Plan; long discussion to further explain events .  Repeat CBC today Pt aware that follow up colonoscopy  Recommended in 3 years    [name] Genia [name] PA-C 10/13/2014   Cc: Nolene Ebbs, MD

## 2014-10-13 NOTE — Patient Instructions (Signed)
Please go to the basement level to have your labs drawn.  We will get back to you with the results. 

## 2014-10-14 ENCOUNTER — Ambulatory Visit: Payer: 59

## 2014-10-14 ENCOUNTER — Encounter: Payer: 59 | Admitting: Occupational Therapy

## 2014-10-14 NOTE — Progress Notes (Signed)
Reviewed and agree with management. Kylinn Shropshire D. Zelig Gacek, M.D., FACG  

## 2014-10-17 ENCOUNTER — Ambulatory Visit: Payer: 59

## 2014-10-17 DIAGNOSIS — R269 Unspecified abnormalities of gait and mobility: Secondary | ICD-10-CM | POA: Diagnosis not present

## 2014-10-17 DIAGNOSIS — R531 Weakness: Secondary | ICD-10-CM

## 2014-10-17 NOTE — Therapy (Signed)
[facility] [address] [place], [place], [address] Phone: [phone]   Fax:  [phone]  Physical Therapy Treatment  Patient Details  Name: Erica Cooper MRN: [id number] Date of Birth: [birth year] Referring Provider:  Tillman Sers, MD  Encounter Date: 10/17/2014      PT End of Session - 10/17/14 1437    Visit Number 8   Number of Visits 17   Date for PT Re-Evaluation 11/08/14   Authorization Type G-code every 10th visit.   PT Start Time 1017   PT Stop Time 1058   PT Time Calculation (min) 41 min   Equipment Utilized During Treatment Gait belt   Activity Tolerance Patient tolerated treatment well   Behavior During Therapy WFL for tasks assessed/performed      Past Medical History  Diagnosis Date  . Stroke 2007    weakness left side  . Hypertension   . Diabetes     borderline/ no meds  . Obesity   . Hyperlipidemia   . GERD (gastroesophageal reflux disease)   . GI bleeding 09/2014    Past Surgical History  Procedure Laterality Date  . Colonoscopy    . Colonoscopy N/A 10/08/2014    Procedure: COLONOSCOPY;  Surgeon: Gatha Mayer, MD;  Location: [facility];  Service: Endoscopy;  Laterality: N/A;    There were no vitals filed for this visit.  Visit Diagnosis:  Abnormality of gait  Left-sided weakness      Subjective Assessment - 10/17/14 1020    Subjective Pt denied falls since last visit. Pt was in the hospital after colonoscopy on 10/07/14 and discharged on 10/09/14, she had a bleed s/p colonoscopy and was admitted due to blood loss. Pt brough MD letter clearing pt for PT.   Patient is accompained by: Family member   Pertinent History CVA in 2007, Diabetes per medical chart, HTN   Patient Stated Goals Walk better and use L arm to cook and dress herself   Currently in Pain? No/denies                         [facility] Adult PT Treatment/Exercise - 10/17/14 1022    Ambulation/Gait   Ambulation/Gait Yes   Ambulation/Gait Assistance 5: Supervision;4: Min guard   Ambulation/Gait Assistance Details Pt with improved balance when using SPC vs not using AD. Cues to improve L knee/hip flexion and to improve L heel strike and stride length. Min guard without SPC, and supervision with SPC. Cues for sequencing. Pt noted to improve L knee flexion after using SciFit bike.    Ambulation Distance (Feet) --  25' without AD, 320', 75, 100' with SPC   Assistive device None;Straight cane   Gait Pattern Step-through pattern;Decreased dorsiflexion - left;Decreased hip/knee flexion - left;Decreased stride length;Left circumduction;Wide base of support;Decreased trunk rotation   Ambulation Surface Level;Indoor   Gait velocity --  1.35f/sec without AD; 1.854fsec [facility]   Standardized Balance Assessment Berg Balance Test;Timed Up and Go Test   Berg Balance Test   Sit to Stand Able to stand without using hands and stabilize independently   Standing Unsupported Able to stand safely 2 minutes   Sitting with Back Unsupported but Feet Supported on Floor or Stool Able to sit safely and securely 2 minutes   Stand to Sit Sits safely with minimal use of hands   Transfers Able to transfer safely, minor use of hands   Standing Unsupported with  Eyes Closed Able to stand 10 seconds safely   Standing Ubsupported with Feet Together Able to place feet together independently and stand for 1 minute with supervision   From Standing, Reach Forward with Outstretched Arm Can reach forward >12 cm safely (5")   From Standing Position, Pick up Object from [place] to pick up shoe safely and easily   From Standing Position, Turn to Look Behind Over each Shoulder Looks behind one side only/other side shows less weight shift   Turn 360 Degrees Able to turn 360 degrees safely but slowly   Standing Unsupported, Alternately Place Feet on Step/Stool Able to complete >2 steps/needs  minimal assist   Standing Unsupported, One Foot in Front Able to plae foot ahead of the other independently and hold 30 seconds   Standing on One Leg Able to lift leg independently and hold 5-10 seconds  R LE: 8 sec. LLE: 1sec.   Total Score 46   Timed Up and Go Test   TUG Normal TUG   Normal TUG (seconds) --  19.4sec. without AD, 18.35 with SPC   Knee/Hip Exercises: Aerobic   Stationary Bike SciFit with 2 extremities for 2 minutes on level 1. Performed to improve flexibility. Cues to stay >70RPMs and to move L LE through full ROM.                PT Education - 10/17/14 1437    Education provided Yes   Education Details Goal progress.   Person(s) Educated Patient;Spouse   Methods Explanation   Comprehension Verbalized understanding          PT Short Term Goals - 10/17/14 1439    PT SHORT TERM GOAL #1   Title Pt will be independent in HEP to improve strength, balance, and safety during functional mobility. Target date: 10/07/14.   Status On-going   PT SHORT TERM GOAL #2   Title Pt will improve BERG score to >/=44/56 to decrease falls risk. Target date: 10/07/14.   Status Achieved   PT SHORT TERM GOAL #3   Title Pt will ambulate 300' over even terrain with LRAD at MOD I level to improve functional mobilty. Target date: 10/07/14.   Status Partially Met   PT SHORT TERM GOAL #4   Title Pt will perform TUG with LRAD in </=13.5 seconds to decrease falls risk. Target date: 10/07/14.   Status Not Met           PT Long Term Goals - 09/16/14 2133    PT LONG TERM GOAL #1   Title Pt will verbalize understanding of CVA symptoms and risk factors to reduce risk of future CVA. Target date: 11/04/14.   Status On-going   PT LONG TERM GOAL #2   Title Pt will ambulate 600' over even/uneven terrain with LRAD at MOD I level to improve functional mobility and to play with her grandchildren. Target dat: 11/04/14.   Status On-going   PT LONG TERM GOAL #3   Title Pt will improve BERG score  to >/=48/56 to decrease falls risk. Target date: 11/04/14.   Status On-going   PT LONG TERM GOAL #4   Title Pt will improve her gait speed with LRAD to >/=1.68f/sec. to reduce falls risk. Target date: 11/04/14.   Status On-going   PT LONG TERM GOAL #5   Title Pt will improve FOTO score by 10 points to improve quality of life. Target date: 11/04/14.   Status On-going   PT LONG TERM GOAL #6  Title Pt will be able to peform reaching outside BOS activities (5 inches) while standing with feet apart for 5 minutes,without LOB, at MOD I level in order to cook at home. Target date: 11/04/14.   Status On-going               Plan - 10/17/14 1437    Clinical Impression Statement Pt demonstrated progress as she met STG2. Pt did not meet STGs 3 or 4, this could be due to pt's recent hospitalization and inability to start OPPT again until this week. Continue with POC.   Pt will benefit from skilled therapeutic intervention in order to improve on the following deficits Abnormal gait;Decreased endurance;Decreased knowledge of use of DME;Decreased balance;Decreased mobility;Decreased strength;Impaired UE functional use;Decreased range of motion;Other (comment)   Rehab Potential Fair   Clinical Impairments Affecting Rehab Potential CVA was 8.5 years ago   PT Frequency 2x / week   PT Duration 8 weeks   PT Treatment/Interventions ADLs/Self Care Home Management;Gait training;Neuromuscular re-education;Stair training;Biofeedback;Functional mobility training;Patient/family education;Therapeutic activities;Electrical Stimulation;Therapeutic exercise;Manual techniques;DME Instruction;Balance training;Other (comment)   PT Next Visit Plan Finish checking STGs.  Nustep first for flexibility.  Trial foot pedaler as possible option for home use.  Gait.   Consulted and Agree with Plan of Care Patient;Family member/caregiver   Family Member Consulted pt's husband-[name]?        Problem List Patient Active Problem  List   Diagnosis Date Noted  . GI bleed 10/08/2014  . Rectal bleeding 10/07/2014  . Acute lower GI bleeding 10/07/2014  . S/P colonoscopic polypectomy 10/07/2014  . Syncope due to orthostatic hypotension 10/07/2014  . Dyspnea 08/27/2013  . Abnormal ECG 08/27/2013  . Stroke   . Hypertension   . Diabetes   . Obesity   . Hyperlipidemia     Quantae Martel L 10/17/2014, 2:40 PM  [facility] [address] [place], [place], [address] Phone: [phone]   Fax:  [phone]     Geoffry Paradise, PT,DPT 10/17/2014 2:40 PM Phone: [phone] Fax: [phone]

## 2014-10-21 ENCOUNTER — Ambulatory Visit: Payer: 59 | Admitting: Occupational Therapy

## 2014-10-21 ENCOUNTER — Ambulatory Visit: Payer: 59

## 2014-10-21 ENCOUNTER — Encounter: Payer: Self-pay | Admitting: Occupational Therapy

## 2014-10-21 DIAGNOSIS — G8110 Spastic hemiplegia affecting unspecified side: Secondary | ICD-10-CM

## 2014-10-21 DIAGNOSIS — R531 Weakness: Secondary | ICD-10-CM

## 2014-10-21 DIAGNOSIS — IMO0002 Reserved for concepts with insufficient information to code with codable children: Secondary | ICD-10-CM

## 2014-10-21 DIAGNOSIS — R269 Unspecified abnormalities of gait and mobility: Secondary | ICD-10-CM | POA: Diagnosis not present

## 2014-10-21 NOTE — Therapy (Signed)
[facility] [address] [place], [place], [address] Phone: [phone]   Fax:  [phone]  Physical Therapy Treatment  Patient Details  Name: Erica Cooper MRN: [id number] Date of Birth: [birth year] Referring Provider:  Tillman Sers, MD  Encounter Date: 10/21/2014      PT End of Session - 10/21/14 1058    Visit Number 9   Number of Visits 17   Date for PT Re-Evaluation 11/08/14   Authorization Type G-code every 10th visit.   PT Start Time 1017   PT Stop Time 1057   PT Time Calculation (min) 40 min   Equipment Utilized During Treatment Gait belt   Activity Tolerance Patient tolerated treatment well   Behavior During Therapy WFL for tasks assessed/performed      Past Medical History  Diagnosis Date  . Stroke 2007    weakness left side  . Hypertension   . Diabetes     borderline/ no meds  . Obesity   . Hyperlipidemia   . GERD (gastroesophageal reflux disease)   . GI bleeding 09/2014    Past Surgical History  Procedure Laterality Date  . Colonoscopy    . Colonoscopy N/A 10/08/2014    Procedure: COLONOSCOPY;  Surgeon: Gatha Mayer, MD;  Location: [facility];  Service: Endoscopy;  Laterality: N/A;    There were no vitals filed for this visit.  Visit Diagnosis:  Left-sided weakness  Lack of coordination due to stroke      Subjective Assessment - 10/21/14 1018    Subjective Pt denied falls or changes since last visit.   Patient is accompained by: Family member   Pertinent History CVA in 2007, Diabetes per medical chart, HTN   Patient Stated Goals Walk better and use L arm to cook and dress herself   Currently in Pain? No/denies                         [facility] Adult PT Treatment/Exercise - 10/21/14 1120    Balance   Balance Assessed Yes   High Level Balance   High Level Balance Activities Side stepping;Braiding;Head turns;Tandem walking;Other (comment)  head turns during forward  amb.   High Level Balance Comments 4x7'/activity with 0-1 UE support at counter as needed. Performed with min guard to supervision, except min guard and min A required during tandem and braiding. Cues to improve L foot clearance/L knee flexion and demonstration for technique.                PT Education - 10/21/14 1019    Education provided Yes   Education Details Reviewed balance HEP and progressed as tolerated (see pt instructions for details). Pt performed all activities in pt instructions.   Person(s) Educated Patient;Spouse   Methods Explanation;Demonstration;Tactile cues;Verbal cues;Handout   Comprehension Verbalized understanding;Returned demonstration          PT Short Term Goals - 10/17/14 1439    PT SHORT TERM GOAL #1   Title Pt will be independent in HEP to improve strength, balance, and safety during functional mobility. Target date: 10/07/14.   Status On-going   PT SHORT TERM GOAL #2   Title Pt will improve BERG score to >/=44/56 to decrease falls risk. Target date: 10/07/14.   Status Achieved   PT SHORT TERM GOAL #3   Title Pt will ambulate 300' over even terrain with LRAD at MOD I level to improve functional mobilty. Target date: 10/07/14.   Status Partially  Met   PT SHORT TERM GOAL #4   Title Pt will perform TUG with LRAD in </=13.5 seconds to decrease falls risk. Target date: 10/07/14.   Status Not Met           PT Long Term Goals - 09/16/14 2133    PT LONG TERM GOAL #1   Title Pt will verbalize understanding of CVA symptoms and risk factors to reduce risk of future CVA. Target date: 11/04/14.   Status On-going   PT LONG TERM GOAL #2   Title Pt will ambulate 600' over even/uneven terrain with LRAD at MOD I level to improve functional mobility and to play with her grandchildren. Target dat: 11/04/14.   Status On-going   PT LONG TERM GOAL #3   Title Pt will improve BERG score to >/=48/56 to decrease falls risk. Target date: 11/04/14.   Status On-going   PT  LONG TERM GOAL #4   Title Pt will improve her gait speed with LRAD to >/=1.55ft/sec. to reduce falls risk. Target date: 11/04/14.   Status On-going   PT LONG TERM GOAL #5   Title Pt will improve FOTO score by 10 points to improve quality of life. Target date: 11/04/14.   Status On-going   PT LONG TERM GOAL #6   Title Pt will be able to peform reaching outside BOS activities (5 inches) while standing with feet apart for 5 minutes,without LOB, at MOD I level in order to cook at home. Target date: 11/04/14.   Status On-going               Plan - 10/21/14 1058    Clinical Impression Statement Pt demonstrated progress as she was ind. in balance HEP and was able to tolerate progression of HEP to high level balance activities . PT will assess flexibility/strengthening HEP next session.  Pt required intermittent UE support during braiding and tandem stance due to LOB epidoes, as pt had difficulty clearing L foot. Continue with POC.   Pt will benefit from skilled therapeutic intervention in order to improve on the following deficits Abnormal gait;Decreased endurance;Decreased knowledge of use of DME;Decreased balance;Decreased mobility;Decreased strength;Impaired UE functional use;Decreased range of motion;Other (comment)   Rehab Potential Fair   Clinical Impairments Affecting Rehab Potential CVA was 8.5 years ago   PT Frequency 2x / week   PT Duration 8 weeks   PT Treatment/Interventions ADLs/Self Care Home Management;Gait training;Neuromuscular re-education;Stair training;Biofeedback;Functional mobility training;Patient/family education;Therapeutic activities;Electrical Stimulation;Therapeutic exercise;Manual techniques;DME Instruction;Balance training;Other (comment)   PT Next Visit Plan G-CODE.  Finish checking strengthening HEP STGs.  Nustep first for flexibility.  Trial foot pedaler as possible option for home use.  Gait.   Consulted and Agree with Plan of Care Patient;Family member/caregiver    Family Member Consulted pt's husband-[name]?        Problem List Patient Active Problem List   Diagnosis Date Noted  . GI bleed 10/08/2014  . Rectal bleeding 10/07/2014  . Acute lower GI bleeding 10/07/2014  . S/P colonoscopic polypectomy 10/07/2014  . Syncope due to orthostatic hypotension 10/07/2014  . Dyspnea 08/27/2013  . Abnormal ECG 08/27/2013  . Stroke   . Hypertension   . Diabetes   . Obesity   . Hyperlipidemia     Jaziel Bennett L 10/21/2014, 11:24 AM  [facility] [address] [place], [place], [address] Phone: [phone]   Fax:  [phone]     Zerita Boers, PT,DPT 10/21/2014 11:24 AM Phone: [phone] Fax: [phone]

## 2014-10-21 NOTE — Patient Instructions (Signed)
Perform in a corner with chair in front of you for safety.  Feet Together (Compliant Surface) Varied Arm Positions - Eyes Closed   Stand on compliant surface: __pillow______ with feet together and arms at your side. Close eyes and visualize upright position. Hold_30___ seconds. Repeat __3__ times per session. Do __1__ sessions per day.  Copyright  VHI. All rights reserved.  Single Leg - Eyes Open   Holding support, lift right leg while maintaining balance over other leg. Progress to removing hands from support surface for longer periods of time. Hold_10-30___ seconds. Repeat __3__ times per session with each leg. Do __1__ sessions per day.  Copyright  VHI. All rights reserved.  Feet Heel-Toe "Tandem", Varied Arm Positions - Eyes Open   With eyes open, right foot directly in front of the other, arms at your side, look straight ahead at a stationary object. Hold __30__ seconds. Repeat _3___ times per session with each leg in front. Do __1__ sessions per day.  Copyright  VHI. All rights reserved.   Perform at counter with hand support as needed.  Side to Side Head Motion   Perform without assistive device. Walking on solid surface for 10 feet, turn head and eyes to left for ___2_ steps.  Then, turn head and eyes to opposite side for __2__ steps. Repeat sequence _4___ times per session. Do __1__ sessions per day.   Copyright  VHI. All rights reserved.  Up / Down Head Motion   Perform without assistive device. Walking for 10 feet on solid surface, move head and eyes toward ceiling for _2___ steps. Then, move head and eyes toward floor for _2___ steps. Repeat _4___ times per session. Do __1__ sessions per day.   Copyright  VHI. All rights reserved.  Side-Stepping   Walk to left side with eyes open. Take 10 even steps, leading with same foot. Make sure each foot lifts off the floor. Repeat in opposite direction. Repeat for _4___ times per session. Do _1___ sessions per  day.  Copyright  VHI. All rights reserved.

## 2014-10-21 NOTE — Therapy (Signed)
[facility] [address] [place], [place], [address] Phone: [phone]   Fax:  [phone]  Occupational Therapy Treatment  Patient Details  Name: Erica Cooper MRN: [id number] Date of Birth: [birth year] Referring Provider:  Nolene Ebbs, MD  Encounter Date: 10/21/2014      OT End of Session - 10/21/14 1311    Visit Number 2   Number of Visits 9   Date for OT Re-Evaluation 10/31/14   Authorization Type [employer]   Authorization Time Period WEEK 1/4   Authorization - Visit Number 2   Authorization - Number of Visits 10   OT Start Time 4580   OT Stop Time 1015   OT Time Calculation (min) 40 min   Activity Tolerance Patient tolerated treatment well      Past Medical History  Diagnosis Date  . Stroke 2007    weakness left side  . Hypertension   . Diabetes     borderline/ no meds  . Obesity   . Hyperlipidemia   . GERD (gastroesophageal reflux disease)   . GI bleeding 09/2014    Past Surgical History  Procedure Laterality Date  . Colonoscopy    . Colonoscopy N/A 10/08/2014    Procedure: COLONOSCOPY;  Surgeon: Gatha Mayer, MD;  Location: [facility];  Service: Endoscopy;  Laterality: N/A;    There were no vitals filed for this visit.  Visit Diagnosis:  Left-sided weakness  Spastic hemiplegia affecting nondominant side      Subjective Assessment - 10/21/14 0941    Subjective  I was in the hospital due to bleeding after colonscopy. (Pt has letter to resume therapy)   Patient is accompained by: Family member  spouse   Pertinent History CVA w/ residual Lt hemiparesis (Aug 2007)   Patient Stated Goals I want to work on my Lt arm   Currently in Pain? No/denies                      OT Treatments/Exercises (OP) - 10/21/14 0001    ADLs   ADL Comments Pt provided w/ A/E recommendations and handouts to increase ease, safety and independence with cooking including: one handed cutting  board, one handed can opener, pot stabalizer, and rocker knife for cutting food. Pt return demo of rocker knife and pt also shown one handed cutting board   Splinting   Splinting Began fabrication of thumb splint for thumb palmer abduction. Will finish next session.                 OT Education - 10/21/14 1016    Education provided Yes   Education Details A/E recommendations   Person(s) Educated Patient;Spouse   Methods Explanation;Demonstration;Handout   Comprehension Verbalized understanding             OT Long Term Goals - 09/30/14 1435    OT LONG TERM GOAL #1   Title Independent w/ HEP for LUE (DUE 10/31/14)   Time 4   Period Weeks   Status New   OT LONG TERM GOAL #2   Title Verbalize understanding with potential A/E needs for greater ease/safety with cooking/IADLS and how to acquire A/E   Time 4   Period Weeks   Status New   OT LONG TERM GOAL #3   Title Independent with splint wear and care for Lt thumb   Time 4   Period Weeks   Status New   OT LONG TERM GOAL #4  Title Improve LUE function as evidenced by performing 10 blocks or more on Box & Blocks test   Baseline 5   Time 4   Period Weeks   Status New               Plan - 10/21/14 1312    Clinical Impression Statement Pt hospitalized and returning today for the first time since 09/30/14 to resume therapy. (Pt has resume therapy orders)   Plan Finish thumb splint, HEP for LUE   OT Home Exercise Plan A/E recommendations issued 10/21/14   Consulted and Agree with Plan of Care Patient;Family member/caregiver   Family Member Consulted spouse        Problem List Patient Active Problem List   Diagnosis Date Noted  . GI bleed 10/08/2014  . Rectal bleeding 10/07/2014  . Acute lower GI bleeding 10/07/2014  . S/P colonoscopic polypectomy 10/07/2014  . Syncope due to orthostatic hypotension 10/07/2014  . Dyspnea 08/27/2013  . Abnormal ECG 08/27/2013  . Stroke   . Hypertension   . Diabetes   .  Obesity   . Hyperlipidemia     Carey Bullocks, OTR/L 10/21/2014, 1:14 PM  [facility] [address] [place], [place], [address] Phone: [phone]   Fax:  [phone]

## 2014-10-23 ENCOUNTER — Encounter: Payer: Self-pay | Admitting: Occupational Therapy

## 2014-10-23 ENCOUNTER — Ambulatory Visit: Payer: 59 | Admitting: Occupational Therapy

## 2014-10-23 ENCOUNTER — Ambulatory Visit: Payer: 59 | Admitting: Physical Therapy

## 2014-10-23 DIAGNOSIS — R531 Weakness: Secondary | ICD-10-CM

## 2014-10-23 DIAGNOSIS — R269 Unspecified abnormalities of gait and mobility: Secondary | ICD-10-CM | POA: Diagnosis not present

## 2014-10-23 DIAGNOSIS — G8110 Spastic hemiplegia affecting unspecified side: Secondary | ICD-10-CM

## 2014-10-23 DIAGNOSIS — IMO0002 Reserved for concepts with insufficient information to code with codable children: Secondary | ICD-10-CM

## 2014-10-23 NOTE — Patient Instructions (Signed)
Your Splint This splint should initially be fitted by a healthcare practitioner.  The healthcare practitioner is responsible for providing wearing instructions and precautions to the patient, other healthcare practitioners and care provider involved in the patient's care.  This splint was custom made for you. Please read the following instructions to learn about wearing and caring for your splint.  Precautions Should your splint cause any of the following problems, remove the splint immediately and contact your therapist/physician.  Swelling  Severe Pain  Pressure Areas or blisters  Stiffness  Numbness  Do not wear your splint while operating machinery unless it has been fabricated for that purpose.  When To Wear Your Splint Where your splint according to your therapist/physician instructions. Wear during the day only,  NOT at night. Take off to shower, wash dishes, or wash hands.  Build up tolerance initially, starting 1-2 hours, and then take off and check skin for pressure sores/blisters. If no problems, then gradually increase hours during the day  Care and Cleaning of Your Splint 1. Keep your splint away from open flames or heat sources. Do NOT leave in car. 2. Your splint will lose its shape in temperatures over 135 degrees Farenheit, ( in car windows, near radiators, ovens or in hot water).  Never make any adjustments to your splint, if the splint needs adjusting remove it and make an appointment to see your therapist. 3. Your splint, including the cushion liner may be cleaned with rubbing alcohol.  Do not immerse in hot water over 135 degrees Farenheit. 4. Straps may be washed with soap and water, but do not moisten the self-adhesive portion. 5. For ink or hard to remove spots use a scouring cleanser which contains chlorine.  Rinse the splint thoroughly after using chlorine cleanser. 6.

## 2014-10-23 NOTE — Therapy (Addendum)
[facility] [address] [place], [place], [address] Phone: [phone]   Fax:  [phone]  Physical Therapy Treatment  Patient Details  Name: Erica Cooper MRN: [id number] Date of Birth: [birth year] Referring Provider:  Helene Shoe, MD  Encounter Date: 10/23/2014    Past Medical History  Diagnosis Date  . Stroke 2007    weakness left side  . Hypertension   . Diabetes     borderline/ no meds  . Obesity   . Hyperlipidemia   . GERD (gastroesophageal reflux disease)   . GI bleeding 09/2014    Past Surgical History  Procedure Laterality Date  . Colonoscopy    . Colonoscopy N/A 10/08/2014    Procedure: COLONOSCOPY;  Surgeon: Iva Boop, MD;  Location: [facility];  Service: Endoscopy;  Laterality: N/A;    There were no vitals filed for this visit.  Visit Diagnosis:  Left-sided weakness  Abnormality of gait   Visit number: 10 of 17 Re eval: 11/08/14 Time of visit: 1017-1102 for 45 minutes.      Subjective Assessment - 10/28/14 1111    Subjective Denies falls or changes since last visit.   Patient is accompained by: Family member   Pertinent History CVA in 2007, Diabetes per medical chart, HTN   Patient Stated Goals Walk better and use L arm to cook and dress herself   Currently in Pain? No/denies              [facility] Adult PT Treatment/Exercise - 10/28/14 1111    Knee/Hip Exercises: Aerobic   Stationary Bike Scifit level 2.0 LE's only x 6 minutes for flexibility of LLE      Reviewed pt's HEP as provided during previous sessions.  Pt able to perform supine hip flexor stretch, seated hamstring and heel cord stretch, standing trunk rotation, hip abduction, hip adduction, L heel slide AAROM, LLE bridge, sit<>stand, seated dorsiflexion, standing heel raises, and eyes open and eyes closed on pillow with narrow BOS and wide BOS with head turns and head nods.  Pt unable to perform dorsiflexion on L  with resistance or in standing position.  Also unable to march with LLE in standing.    Discussed using foot pedaler for flexibility and pt states she already has one at home and uses it about 20 minutes a day.            PT Short Term Goals - 10/23/14 1605    PT SHORT TERM GOAL #1   Title Pt will be independent in HEP to improve strength, balance, and safety during functional mobility. Target date: 10/07/14.   Status Achieved   PT SHORT TERM GOAL #2   Title Pt will improve BERG score to >/=44/56 to decrease falls risk. Target date: 10/07/14.   Status Achieved   PT SHORT TERM GOAL #3   Title Pt will ambulate 300' over even terrain with LRAD at MOD I level to improve functional mobilty. Target date: 10/07/14.   Status Partially Met   PT SHORT TERM GOAL #4   Title Pt will perform TUG with LRAD in </=13.5 seconds to decrease falls risk. Target date: 10/07/14.   Status Not Met           PT Long Term Goals - 09/16/14 2133    PT LONG TERM GOAL #1   Title Pt will verbalize understanding of CVA symptoms and risk factors to reduce risk of future CVA. Target date: 11/04/14.   Status On-going  PT LONG TERM GOAL #2   Title Pt will ambulate 600' over even/uneven terrain with LRAD at MOD I level to improve functional mobility and to play with her grandchildren. Target dat: 11/04/14.   Status On-going   PT LONG TERM GOAL #3   Title Pt will improve BERG score to >/=48/56 to decrease falls risk. Target date: 11/04/14.   Status On-going   PT LONG TERM GOAL #4   Title Pt will improve her gait speed with LRAD to >/=1.69ft/sec. to reduce falls risk. Target date: 11/04/14.   Status On-going   PT LONG TERM GOAL #5   Title Pt will improve FOTO score by 10 points to improve quality of life. Target date: 11/04/14.   Status On-going   PT LONG TERM GOAL #6   Title Pt will be able to peform reaching outside BOS activities (5 inches) while standing with feet apart for 5 minutes,without LOB, at MOD I level in  order to cook at home. Target date: 11/04/14.   Status On-going               Plan - November 10, 2014 0818    Clinical Impression Statement Pt met STG #1. Continue with LLE stiffness/tone and difficulty clearing LLE with gait. Continue PT per POC.   Pt will benefit from skilled therapeutic intervention in order to improve on the following deficits Abnormal gait;Decreased endurance;Decreased knowledge of use of DME;Decreased balance;Decreased mobility;Decreased strength;Impaired UE functional use;Decreased range of motion;Other (comment)   Rehab Potential Fair   Clinical Impairments Affecting Rehab Potential CVA was 8.5 years ago   PT Frequency 2x / week   PT Duration 8 weeks   PT Treatment/Interventions ADLs/Self Care Home Management;Gait training;Neuromuscular re-education;Stair training;Biofeedback;Functional mobility training;Patient/family education;Therapeutic activities;Electrical Stimulation;Therapeutic exercise;Manual techniques;DME Instruction;Balance training;Other (comment)   PT Next Visit Plan Nustep for flexibility.  L hip strengthening and stretching.   Consulted and Agree with Plan of Care Patient          G-Codes - 11-10-2014 0818    Functional Assessment Tool Used BERG 46/56;TUG 19.4 sec no device;gait speed 1.98 ft/sec    Functional Limitation Mobility: Walking and moving around   Mobility: Walking and Moving Around Current Status [phone]) At least 20 percent but less than 40 percent impaired, limited or restricted   Mobility: Walking and Moving Around Goal Status [phone]) At least 20 percent but less than 40 percent impaired, limited or restricted      Problem List Patient Active Problem List   Diagnosis Date Noted  . GI bleed 10/08/2014  . Rectal bleeding 10/07/2014  . Acute lower GI bleeding 10/07/2014  . S/P colonoscopic polypectomy 10/07/2014  . Syncope due to orthostatic hypotension 10/07/2014  . Dyspnea 08/27/2013  . Abnormal ECG 08/27/2013  . Stroke   .  Hypertension   . Diabetes   . Obesity   . Hyperlipidemia     Miller,Jennifer L Nov 10, 2014, 8:19 AM  [facility] [address] [place], [place], [address] Phone: [phone]   Fax:  [place], [place] 11-10-14 8:19 AM Phone: [phone] Fax: [phone]  G-code completed by PT. Geoffry Paradise, PT,DPT 2014/11/10 8:19 AM Phone: [phone] Fax: [phone]

## 2014-10-23 NOTE — Therapy (Signed)
[facility] [address] [place], [place], [address] Phone: [phone]   Fax:  [phone]  Occupational Therapy Treatment  Patient Details  Name: Erica Cooper MRN: [id number] Date of Birth: 04/26/1951 Referring Provider:  Nolene Ebbs, MD  Encounter Date: 10/23/2014      OT End of Session - 10/23/14 1258    Visit Number 3   Number of Visits 9   Date for OT Re-Evaluation 10/31/14   Authorization Type U[employer] Medicare   Authorization Time Period WEEK 1/4   Authorization - Visit Number 3   Authorization - Number of Visits 10   OT Start Time 0930   OT Stop Time 1015   OT Time Calculation (min) 45 min   Activity Tolerance Patient tolerated treatment well      Past Medical History  Diagnosis Date  . Stroke 2007    weakness left side  . Hypertension   . Diabetes     borderline/ no meds  . Obesity   . Hyperlipidemia   . GERD (gastroesophageal reflux disease)   . GI bleeding 09/2014    Past Surgical History  Procedure Laterality Date  . Colonoscopy    . Colonoscopy N/A 10/08/2014    Procedure: COLONOSCOPY;  Surgeon: Gatha Mayer, MD;  Location: [place];  Service: Endoscopy;  Laterality: N/A;    There were no vitals filed for this visit.  Visit Diagnosis:  Spastic hemiplegia affecting nondominant side  Lack of coordination due to stroke      Subjective Assessment - 10/23/14 1015    Subjective  I like it (re: splint)   Patient is accompained by: Family member  husband   Patient Stated Goals I want to work on my Lt arm   Currently in Pain? No/denies                      OT Treatments/Exercises (OP) - 10/23/14 0001    Splinting   Splinting Finished fabrication and fitting of Lt thumb splint for palmer abduction. Also sized pt for Oval 8 finger splint (size 9) to prevent thumb IP flexion. Pt shown how to don/doff both; pt return demo. Pt practiced donning with assist only for Oval 8  finger splint and doffed both I'ly. Practiced picking up various cylindrical objects with both thumb and finger splint on with greater success. Unable to pick up without splints on, unless other hand assisted. Carefully reviewed wear and care of splint and precautions with pt and husband. Issued handout (which husband reports son can help with re-reading at home if needed secondary English not primary language)                OT Education - 10/23/14 1014    Education provided Yes   Education Details splint wear and care   Person(s) Educated Patient;Spouse   Methods Explanation;Demonstration;Handout   Comprehension Verbalized understanding;Returned demonstration             OT Long Term Goals - 09/30/14 1435    OT LONG TERM GOAL #1   Title Independent w/ HEP for LUE (DUE 10/31/14)   Time 4   Period Weeks   Status New   OT LONG TERM GOAL #2   Title Verbalize understanding with potential A/E needs for greater ease/safety with cooking/IADLS and how to acquire A/E   Time 4   Period Weeks   Status New   OT LONG TERM GOAL #3   Title Independent with splint  wear and care for Lt thumb   Time 4   Period Weeks   Status New   OT LONG TERM GOAL #4   Title Improve LUE function as evidenced by performing 10 blocks or more on Box & Blocks test   Baseline 5   Time 4   Period Weeks   Status New               Plan - 10/23/14 1259    Clinical Impression Statement Pt approximating LTG's #2 and #3.    Plan assess splints, HEP for LUE   OT Home Exercise Plan A/E recommendations issued 10/21/14; issued splint wear and care instructions 10/23/14   Consulted and Agree with Plan of Care Patient;Family member/caregiver   Family Member Consulted spouse        Problem List Patient Active Problem List   Diagnosis Date Noted  . GI bleed 10/08/2014  . Rectal bleeding 10/07/2014  . Acute lower GI bleeding 10/07/2014  . S/P colonoscopic polypectomy 10/07/2014  . Syncope due to  orthostatic hypotension 10/07/2014  . Dyspnea 08/27/2013  . Abnormal ECG 08/27/2013  . Stroke   . Hypertension   . Diabetes   . Obesity   . Hyperlipidemia     Carey Bullocks, OTR/L 10/23/2014, 1:02 PM  [facility] [address] [place], [place], [address] Phone: [phone]   Fax:  [phone]

## 2014-10-28 ENCOUNTER — Ambulatory Visit: Payer: 59 | Admitting: Physical Therapy

## 2014-10-28 ENCOUNTER — Ambulatory Visit: Payer: 59 | Admitting: Occupational Therapy

## 2014-10-28 ENCOUNTER — Encounter: Payer: Self-pay | Admitting: Occupational Therapy

## 2014-10-28 DIAGNOSIS — R269 Unspecified abnormalities of gait and mobility: Secondary | ICD-10-CM

## 2014-10-28 DIAGNOSIS — G8110 Spastic hemiplegia affecting unspecified side: Secondary | ICD-10-CM

## 2014-10-28 NOTE — Patient Instructions (Signed)
Flexion (Assistive)   Clasp hands together and raise arms above head, keeping elbows as straight as possible. Can be done sitting or lying. Repeat __10__ times. Do _2-3___ sessions per day.    Press-Up With Wand   Press wand up towards ceiling until elbows are straight. Hold __5__ seconds. Repeat _10___ times. Do __2-3__ sessions per day.  All Fours Shoulder Flexion / Extension   Place hands and knees shoulder-width apart, rock back and sit on legs, then rock forward over hands and forearms. Repeat _10___ times SLOWLY. Do __2-3__ sessions per day. Then, slide Rt arm out forwards and backwards increasing weight over Lt arm.   http://cc.[email or web address].[email or web address]   ROTATION: Weight Bearing Reach   Weight bear on Lt hand. Reach across body toward target SLOWLY. _10__ reps per set, _2-3__ sets per day.     Lateral Weight Shift: Upper Trunk Leading   Sit with feet flat on floor. Bring _Lt___ shoulder, head and arm toward side until forearm/elbow just touches sitting surface. Hold __10__ seconds. Return to upright position by pushing through arm Repeat _10___ times per session. Do __2__ sessions per day.       Marland Kitchen

## 2014-10-28 NOTE — Therapy (Signed)
[facility] [address] [place], [place], [address] Phone: [phone]   Fax:  [phone]  Occupational Therapy Treatment  Patient Details  Name: Erica Cooper MRN: [id number] Date of Birth: 02-10-1951 Referring Provider:  Nolene Ebbs, MD  Encounter Date: 10/28/2014      OT End of Session - 10/28/14 1057    Visit Number 4   Number of Visits 9   Date for OT Re-Evaluation 10/31/14   Authorization Type U[employer] Medicare   Authorization Time Period WEEK 2/4   Authorization - Visit Number 4   Authorization - Number of Visits 10   OT Start Time 1005   OT Stop Time 1055   OT Time Calculation (min) 50 min   Activity Tolerance Patient tolerated treatment well      Past Medical History  Diagnosis Date  . Stroke 2007    weakness left side  . Hypertension   . Diabetes     borderline/ no meds  . Obesity   . Hyperlipidemia   . GERD (gastroesophageal reflux disease)   . GI bleeding 09/2014    Past Surgical History  Procedure Laterality Date  . Colonoscopy    . Colonoscopy N/A 10/08/2014    Procedure: COLONOSCOPY;  Surgeon: Gatha Mayer, MD;  Location: [place];  Service: Endoscopy;  Laterality: N/A;    There were no vitals filed for this visit.  Visit Diagnosis:  Spastic hemiplegia affecting nondominant side      Subjective Assessment - 10/28/14 1008    Subjective  The splints seem to be helping   Patient is accompained by: Family member  spouse   Pertinent History CVA w/ residual Lt hemiparesis (Aug 2007)   Patient Stated Goals I want to work on my Lt arm   Currently in Pain? No/denies                      OT Treatments/Exercises (OP) - 10/28/14 0001    Exercises   Exercises Shoulder   Shoulder Exercises: ROM/Strengthening   UBE (Upper Arm Bike) x 8 min. Level 1 with Lt hand wrapped (for reciprocal movement)   Other ROM/Strengthening Exercises see pt instructions - pt return demo of  each   Neurological Re-education Exercises   Other Weight-Bearing Exercises 1 see pt instructions - pt return demo of each   Manual Therapy   Manual Therapy Passive ROM   Manual therapy comments Pt/spouse shown proper way to stretch thumb AVOIDING hyperextension, and how to properly stretch MP joints in flexion while stabalizing wrist.                 OT Education - 10/28/14 1043    Education provided Yes   Education Details HEP for LUE (including cane and wt. bearing)   Person(s) Educated Patient;Spouse   Methods Explanation;Demonstration;Handout   Comprehension Verbalized understanding;Returned demonstration             OT Long Term Goals - 09/30/14 1435    OT LONG TERM GOAL #1   Title Independent w/ HEP for LUE (DUE 10/31/14)   Time 4   Period Weeks   Status New   OT LONG TERM GOAL #2   Title Verbalize understanding with potential A/E needs for greater ease/safety with cooking/IADLS and how to acquire A/E   Time 4   Period Weeks   Status New   OT LONG TERM GOAL #3   Title Independent with splint wear and care for Lt  thumb   Time 4   Period Weeks   Status New   OT LONG TERM GOAL #4   Title Improve LUE function as evidenced by performing 10 blocks or more on Box & Blocks test   Baseline 5   Time 4   Period Weeks   Status New               Plan - 10/28/14 1058    Clinical Impression Statement Pt approximating LTG's. Pt tolerating wt. bearing exercises   Plan review HEP, functional reaching LUE as able, flipping large cards as able, UBE   OT Home Exercise Plan A/E recommendations issued 10/21/14; issued splint wear and care instructions 10/23/14; issued LUE HEP 10/28/14        Problem List Patient Active Problem List   Diagnosis Date Noted  . GI bleed 10/08/2014  . Rectal bleeding 10/07/2014  . Acute lower GI bleeding 10/07/2014  . S/P colonoscopic polypectomy 10/07/2014  . Syncope due to orthostatic hypotension 10/07/2014  . Dyspnea  08/27/2013  . Abnormal ECG 08/27/2013  . Stroke   . Hypertension   . Diabetes   . Obesity   . Hyperlipidemia     Carey Bullocks, OTR/L 10/28/2014, 11:01 AM  [facility] [address] [place], [place], [address] Phone: [phone]   Fax:  [phone]

## 2014-10-29 ENCOUNTER — Other Ambulatory Visit: Payer: Self-pay | Admitting: *Deleted

## 2014-10-29 ENCOUNTER — Other Ambulatory Visit (INDEPENDENT_AMBULATORY_CARE_PROVIDER_SITE_OTHER): Payer: 59

## 2014-10-29 DIAGNOSIS — D509 Iron deficiency anemia, unspecified: Secondary | ICD-10-CM

## 2014-10-29 DIAGNOSIS — G8110 Spastic hemiplegia affecting unspecified side: Secondary | ICD-10-CM | POA: Insufficient documentation

## 2014-10-29 DIAGNOSIS — M6289 Other specified disorders of muscle: Secondary | ICD-10-CM | POA: Diagnosis present

## 2014-10-29 DIAGNOSIS — R269 Unspecified abnormalities of gait and mobility: Secondary | ICD-10-CM | POA: Diagnosis not present

## 2014-10-29 DIAGNOSIS — R279 Unspecified lack of coordination: Secondary | ICD-10-CM | POA: Diagnosis present

## 2014-10-29 DIAGNOSIS — I6980 Unspecified sequelae of other cerebrovascular disease: Secondary | ICD-10-CM | POA: Diagnosis present

## 2014-10-29 LAB — CBC WITH DIFFERENTIAL/PLATELET
BASOS ABS: 0 10*3/uL (ref 0.0–0.1)
Basophils Relative: 0.5 % (ref 0.0–3.0)
Eosinophils Absolute: 0.1 10*3/uL (ref 0.0–0.7)
Eosinophils Relative: 1.3 % (ref 0.0–5.0)
HEMATOCRIT: 34.2 % — AB (ref 36.0–46.0)
Hemoglobin: 10.9 g/dL — ABNORMAL LOW (ref 12.0–15.0)
Lymphocytes Relative: 34.3 % (ref 12.0–46.0)
Lymphs Abs: 2.8 10*3/uL (ref 0.7–4.0)
MCHC: 31.9 g/dL (ref 30.0–36.0)
MCV: 86.6 fl (ref 78.0–100.0)
MONO ABS: 0.6 10*3/uL (ref 0.1–1.0)
MONOS PCT: 6.9 % (ref 3.0–12.0)
NEUTROS ABS: 4.6 10*3/uL (ref 1.4–7.7)
NEUTROS PCT: 57 % (ref 43.0–77.0)
Platelets: 320 10*3/uL (ref 150.0–400.0)
RBC: 3.94 Mil/uL (ref 3.87–5.11)
RDW: 16.4 % — ABNORMAL HIGH (ref 11.5–15.5)
WBC: 8 10*3/uL (ref 4.0–10.5)

## 2014-10-29 NOTE — Therapy (Signed)
[facility] [address] [place], [place], [address] Phone: [phone]   Fax:  [phone]  Physical Therapy Treatment  Patient Details  Name: Erica Cooper MRN: [id number] Date of Birth: 28-Sep-1950 Referring Provider:  Tillman Sers, MD  Encounter Date: 10/28/2014      PT End of Session - 10/29/14 1403    Visit Number 11   Number of Visits 17   Date for PT Re-Evaluation 11/08/14   Authorization Type G-code every 10th visit.   PT Start Time 1104   PT Stop Time 1145   PT Time Calculation (min) 41 min   Equipment Utilized During Treatment Gait belt   Activity Tolerance Patient tolerated treatment well   Behavior During Therapy WFL for tasks assessed/performed      Past Medical History  Diagnosis Date  . Stroke 2007    weakness left side  . Hypertension   . Diabetes     borderline/ no meds  . Obesity   . Hyperlipidemia   . GERD (gastroesophageal reflux disease)   . GI bleeding 09/2014    Past Surgical History  Procedure Laterality Date  . Colonoscopy    . Colonoscopy N/A 10/08/2014    Procedure: COLONOSCOPY;  Surgeon: Gatha Mayer, MD;  Location: [facility];  Service: Endoscopy;  Laterality: N/A;    There were no vitals filed for this visit.  Visit Diagnosis:  Abnormality of gait      Subjective Assessment - 10/28/14 1111    Subjective Denies falls or changes since last visit.   Patient is accompained by: Family member   Pertinent History CVA in 2007, Diabetes per medical chart, HTN   Patient Stated Goals Walk better and use L arm to cook and dress herself   Currently in Pain? No/denies                         [facility] Adult PT Treatment/Exercise - 10/29/14 1356    Transfers   Transfers Sit to Stand;Stand to Sit   Sit to Stand 6: Modified independent (Device/Increase time);With upper extremity assist   Stand to Sit 6: Modified independent (Device/Increase time);With upper  extremity assist   Ambulation/Gait   Ambulation/Gait Yes   Ambulation/Gait Assistance 5: Supervision   Ambulation/Gait Assistance Details trialed various assistive devices-used L toe off AFO with .5cm heel wedge and simulated toe cap along with 1cm lift in right shoe.  Pt with improved clearance on L LE but still needs tactile and verbal cues.   Ambulation Distance (Feet) 200 Feet  multiple times   Assistive device None   Gait Pattern Step-through pattern;Decreased dorsiflexion - left;Decreased hip/knee flexion - left;Decreased stride length;Left circumduction;Wide base of support;Decreased trunk rotation   Ambulation Surface Level;Indoor   Knee/Hip Exercises: Aerobic   Stationary Bike Scifit level 2.0 LE's only x 6 minutes for flexibility of LLE   Knee/Hip Exercises: Standing   Other Standing Knee Exercises sidestepping in parallel bars for hip abduction ROM                PT Education - 10/29/14 1402    Education provided Yes   Education Details trial of .5cm lift in R shoe at home, remove if causes pain or pressure   Person(s) Educated Patient;Spouse   Methods Explanation;Demonstration   Comprehension Verbalized understanding          PT Short Term Goals - 10/23/14 1605    PT SHORT TERM GOAL #1  Title Pt will be independent in HEP to improve strength, balance, and safety during functional mobility. Target date: 10/07/14.   Status Achieved   PT SHORT TERM GOAL #2   Title Pt will improve BERG score to >/=44/56 to decrease falls risk. Target date: 10/07/14.   Status Achieved   PT SHORT TERM GOAL #3   Title Pt will ambulate 300' over even terrain with LRAD at MOD I level to improve functional mobilty. Target date: 10/07/14.   Status Partially Met   PT SHORT TERM GOAL #4   Title Pt will perform TUG with LRAD in </=13.5 seconds to decrease falls risk. Target date: 10/07/14.   Status Not Met           PT Long Term Goals - 09/16/14 2133    PT LONG TERM GOAL #1   Title  Pt will verbalize understanding of CVA symptoms and risk factors to reduce risk of future CVA. Target date: 11/04/14.   Status On-going   PT LONG TERM GOAL #2   Title Pt will ambulate 600' over even/uneven terrain with LRAD at MOD I level to improve functional mobility and to play with her grandchildren. Target dat: 11/04/14.   Status On-going   PT LONG TERM GOAL #3   Title Pt will improve BERG score to >/=48/56 to decrease falls risk. Target date: 11/04/14.   Status On-going   PT LONG TERM GOAL #4   Title Pt will improve her gait speed with LRAD to >/=1.2ft/sec. to reduce falls risk. Target date: 11/04/14.   Status On-going   PT LONG TERM GOAL #5   Title Pt will improve FOTO score by 10 points to improve quality of life. Target date: 11/04/14.   Status On-going   PT LONG TERM GOAL #6   Title Pt will be able to peform reaching outside BOS activities (5 inches) while standing with feet apart for 5 minutes,without LOB, at MOD I level in order to cook at home. Target date: 11/04/14.   Status On-going               Plan - Nov 02, 2014 1404    Clinical Impression Statement Pt with improvement in gait with adaptions/orthotics today.  Continues with decreased strength and increased tone in LLE.  Continue PT per POC.   Pt will benefit from skilled therapeutic intervention in order to improve on the following deficits Abnormal gait;Decreased endurance;Decreased knowledge of use of DME;Decreased balance;Decreased mobility;Decreased strength;Impaired UE functional use;Decreased range of motion;Other (comment)   Rehab Potential Fair   Clinical Impairments Affecting Rehab Potential CVA was 8.5 years ago   PT Frequency 2x / week   PT Duration 8 weeks   PT Treatment/Interventions ADLs/Self Care Home Management;Gait training;Neuromuscular re-education;Stair training;Biofeedback;Functional mobility training;Patient/family education;Therapeutic activities;Electrical Stimulation;Therapeutic exercise;Manual  techniques;DME Instruction;Balance training;Other (comment)   PT Next Visit Plan Continue trial of LAFO.  Follow up on how pt felt like R shoe wedge helped L foot clearance.  Check goals?   Consulted and Agree with Plan of Care Patient   Family Member Consulted pt's husband-[name]?          G-Codes - Nov 02, 2014 0818    Functional Assessment Tool Used BERG 46/56;TUG 19.4 sec no device;gait speed 1.98 ft/sec    Functional Limitation Mobility: Walking and moving around   Mobility: Walking and Moving Around Current Status ([id number]) At least 20 percent but less than 40 percent impaired, limited or restricted   Mobility: Walking and Moving Around Goal Status ([id number]) At least 20  percent but less than 40 percent impaired, limited or restricted      Problem List Patient Active Problem List   Diagnosis Date Noted  . GI bleed 10/08/2014  . Rectal bleeding 10/07/2014  . Acute lower GI bleeding 10/07/2014  . S/P colonoscopic polypectomy 10/07/2014  . Syncope due to orthostatic hypotension 10/07/2014  . Dyspnea 08/27/2013  . Abnormal ECG 08/27/2013  . Stroke   . Hypertension   . Diabetes   . Obesity   . Hyperlipidemia     Narda Bonds 10/29/2014, 2:07 PM  [facility] [address] [place], [place], [address] Phone: [phone]   Fax:  [place], [place] 10/29/2014 2:07 PM Phone: [phone] Fax: [phone]

## 2014-10-31 ENCOUNTER — Ambulatory Visit: Payer: 59 | Attending: Internal Medicine

## 2014-10-31 ENCOUNTER — Ambulatory Visit: Payer: 59 | Admitting: Occupational Therapy

## 2014-10-31 DIAGNOSIS — R269 Unspecified abnormalities of gait and mobility: Secondary | ICD-10-CM

## 2014-10-31 DIAGNOSIS — G8110 Spastic hemiplegia affecting unspecified side: Secondary | ICD-10-CM

## 2014-10-31 DIAGNOSIS — R531 Weakness: Secondary | ICD-10-CM

## 2014-10-31 DIAGNOSIS — IMO0002 Reserved for concepts with insufficient information to code with codable children: Secondary | ICD-10-CM

## 2014-10-31 NOTE — Therapy (Signed)
[facility] [address] [place], [place], [address] Phone: [phone]   Fax:  [phone]  Physical Therapy Treatment  Patient Details  Name: Erica Cooper MRN: [id number] Date of Birth: [birth year] Referring Provider:  Tillman Sers, MD  Encounter Date: 10/31/2014      PT End of Session - 10/31/14 1031    Visit Number 12   Number of Visits 17   Date for PT Re-Evaluation 12/07/14  original POC 11/08/14 re-eval date   Authorization Type G-code every 10th visit.   PT Start Time [phone]   PT Stop Time 1015   PT Time Calculation (min) 44 min   Equipment Utilized During Treatment Gait belt   Activity Tolerance Patient tolerated treatment well   Behavior During Therapy WFL for tasks assessed/performed      Past Medical History  Diagnosis Date  . Stroke 2007    weakness left side  . Hypertension   . Diabetes     borderline/ no meds  . Obesity   . Hyperlipidemia   . GERD (gastroesophageal reflux disease)   . GI bleeding 09/2014    Past Surgical History  Procedure Laterality Date  . Colonoscopy    . Colonoscopy N/A 10/08/2014    Procedure: COLONOSCOPY;  Surgeon: Gatha Mayer, MD;  Location: [facility];  Service: Endoscopy;  Laterality: N/A;    There were no vitals filed for this visit.  Visit Diagnosis:  Abnormality of gait - Plan: PT plan of care cert/re-cert  Left-sided weakness - Plan: PT plan of care cert/re-cert      Subjective Assessment - 10/31/14 0935    Subjective Pt denied falls or changes since last visit.  Pt said she used R heel wedge, but wanted to try it in other shoes today, as it feels a little funny.   Patient is accompained by: Family member   Pertinent History CVA in 2007, Diabetes per medical chart, HTN   Patient Stated Goals Walk better and use L arm to cook and dress herself   Currently in Pain? No/denies                         [facility] Adult PT Treatment/Exercise -  10/31/14 0935    Ambulation/Gait   Ambulation/Gait Yes   Ambulation/Gait Assistance 5: Supervision;4: Min guard   Ambulation/Gait Assistance Details Pt ambulated indoors/outdoors with and without SPC. Pt used SPC over grassy terrain to maintain balance with min guard-supervision and supervision over even terrain. VC's for sequencing with SPC, upright posture, improved L knee flexion and heel strike.   Ambulation Distance (Feet) --  75'x3, 50'x2, 500'   Assistive device Straight cane;None   Gait Pattern Step-through pattern;Decreased dorsiflexion - left;Decreased hip/knee flexion - left;Decreased stride length;Left circumduction;Wide base of support;Decreased trunk rotation   Ambulation Surface Level;Unlevel;Indoor;Outdoor;Paved;Grass   Gait velocity 1.19f/sec  without AD and 1.480fsec with [facility] Standardized Balance Assessment   Standardized Balance Assessment Berg Balance Test;Timed Up and Go Test   Berg Balance Test   Sit to Stand Able to stand without using hands and stabilize independently   Standing Unsupported Able to stand safely 2 minutes   Sitting with Back Unsupported but Feet Supported on Floor or Stool Able to sit safely and securely 2 minutes   Stand to Sit Sits safely with minimal use of hands   Transfers Able to transfer safely, minor use of hands   Standing Unsupported with Eyes Closed Able to  stand 10 seconds safely   Standing Ubsupported with Feet Together Able to place feet together independently and stand 1 minute safely   From Standing, Reach Forward with Outstretched Arm Can reach forward >12 cm safely (5")   From Standing Position, Pick up Object from [place] to pick up shoe, needs supervision   From Standing Position, Turn to Look Behind Over each Shoulder Looks behind from both sides and weight shifts well   Turn 360 Degrees Able to turn 360 degrees safely but slowly   Standing Unsupported, Alternately Place Feet on Step/Stool Able to complete 4 steps without  aid or supervision   Standing Unsupported, One Foot in [place] to place foot tandem independently and hold 30 seconds   Standing on One Leg Able to lift leg independently and hold equal to or more than 3 seconds  4 seconds on RLE and 1 second on LLE   Total Score 48   Timed Up and Go Test   TUG Normal TUG   Normal TUG (seconds) 16.57  without AD, and 19.78 with [facility]                PT Education - 10/31/14 1028    Education provided Yes   Education Details PT educated pt on outcome measures scores and adding four additional weeks of PT in order to meet goals, and pt missed PT visits due to hospital stay.   Person(s) Educated Patient;Spouse   Methods Explanation   Comprehension Verbalized understanding          PT Short Term Goals - 10/23/14 1605    PT SHORT TERM GOAL #1   Title Pt will be independent in HEP to improve strength, balance, and safety during functional mobility. Target date: 10/07/14.   Status Achieved   PT SHORT TERM GOAL #2   Title Pt will improve BERG score to >/=44/56 to decrease falls risk. Target date: 10/07/14.   Status Achieved   PT SHORT TERM GOAL #3   Title Pt will ambulate 300' over even terrain with LRAD at MOD I level to improve functional mobilty. Target date: 10/07/14.   Status Partially Met   PT SHORT TERM GOAL #4   Title Pt will perform TUG with LRAD in </=13.5 seconds to decrease falls risk. Target date: 10/07/14.   Status Not Met           PT Long Term Goals - 10/31/14 1037    PT LONG TERM GOAL #1   Title Pt will verbalize understanding of CVA symptoms and risk factors to reduce risk of future CVA. Target date: 11/04/14.   Baseline All unmet goals will b ecarried over to new POC date: 12/07/14   Status On-going   PT LONG TERM GOAL #2   Title Pt will ambulate 600' over even/uneven terrain with LRAD at MOD I level to improve functional mobility and to play with her grandchildren. Target dat: 11/04/14.   Status Not Met   PT LONG TERM GOAL  #3   Title Pt will improve BERG score to >/=48/56 to decrease falls risk. Target date: 11/04/14.   Status Achieved   PT LONG TERM GOAL #4   Title Pt will improve her gait speed with LRAD to >/=1.13f/sec. to reduce falls risk. Target date: 11/04/14.   Status Partially Met   PT LONG TERM GOAL #5   Title Pt will improve FOTO score by 10 points to improve quality of life. Target date: 11/04/14.   Status On-going  PT LONG TERM GOAL #6   Title Pt will be able to peform reaching outside BOS activities (5 inches) while standing with feet apart for 5 minutes,without LOB, at MOD I level in order to cook at home. Target date: 11/04/14.   Status On-going               Plan - 10/31/14 1032    Clinical Impression Statement Pt demonstrated progress as she met LTGs 3 and partially met LTG 4. Pt did not meet LTG 2 (amb) as she required min guard to supervision during ambulation. Pt's BERG score indicates she is at a moderate falls risk, gait speed and TUG time also places pt at a falls risk. PT will assess LTG 6 next session. Pt would benefit from continued skilled PT to improve balance, strength, functional mobility and endurance. PT will send re-cert to MD today, for 4 addtional weeks of PT, 2x/week.   Pt will benefit from skilled therapeutic intervention in order to improve on the following deficits Abnormal gait;Decreased endurance;Decreased knowledge of use of DME;Decreased balance;Decreased mobility;Decreased strength;Impaired UE functional use;Decreased range of motion;Other (comment)   Rehab Potential Fair   Clinical Impairments Affecting Rehab Potential CVA was 8.5 years ago   PT Frequency 2x / week   PT Duration 8 weeks  PT requesting additional 2x/week for 4 weeks   PT Treatment/Interventions ADLs/Self Care Home Management;Gait training;Neuromuscular re-education;Stair training;Biofeedback;Functional mobility training;Patient/family education;Therapeutic activities;Electrical Stimulation;Therapeutic  exercise;Manual techniques;DME Instruction;Balance training;Other (comment)   PT Next Visit Plan Finish assessing LTG 6. Continue trial of LAFO.  Follow up on how pt felt like R shoe wedge helped L foot clearance (when used in sandal), if appropriate set up appt. with [employer] to come to PT appt.   Consulted and Agree with Plan of Care Patient   Family Member Consulted pt's husband-[name]?        Problem List Patient Active Problem List   Diagnosis Date Noted  . GI bleed 10/08/2014  . Rectal bleeding 10/07/2014  . Acute lower GI bleeding 10/07/2014  . S/P colonoscopic polypectomy 10/07/2014  . Syncope due to orthostatic hypotension 10/07/2014  . Dyspnea 08/27/2013  . Abnormal ECG 08/27/2013  . Stroke   . Hypertension   . Diabetes   . Obesity   . Hyperlipidemia     Miller,Jennifer L 10/31/2014, 10:46 AM  [facility] [address] [place], [place], [address] Phone: [phone]   Fax:  [phone]      Geoffry Paradise, PT,DPT 10/31/2014 10:46 AM Phone: [phone] Fax: [phone]

## 2014-10-31 NOTE — Therapy (Signed)
[facility] [address] [place], [place], [address] Phone: [phone]   Fax:  [phone]  Occupational Therapy Treatment  Patient Details  Name: Erica Cooper MRN: [id number] Date of Birth: [birth year] Referring Provider:  Nolene Ebbs, MD  Encounter Date: 10/31/2014      OT End of Session - 10/31/14 0923    Visit Number 5   Number of Visits 9   Date for OT Re-Evaluation 10/31/14   Authorization Type [employer] Medicare   Authorization Time Period week 2/4   Authorization - Visit Number 5   Authorization - Number of Visits 10   OT Start Time 0850   OT Stop Time 0930   OT Time Calculation (min) 40 min   Activity Tolerance Patient tolerated treatment well   Behavior During Therapy [facility] for tasks assessed/performed      Past Medical History  Diagnosis Date  . Stroke 2007    weakness left side  . Hypertension   . Diabetes     borderline/ no meds  . Obesity   . Hyperlipidemia   . GERD (gastroesophageal reflux disease)   . GI bleeding 09/2014    Past Surgical History  Procedure Laterality Date  . Colonoscopy    . Colonoscopy N/A 10/08/2014    Procedure: COLONOSCOPY;  Surgeon: Gatha Mayer, MD;  Location: [facility];  Service: Endoscopy;  Laterality: N/A;    There were no vitals filed for this visit.  Visit Diagnosis:  Spastic hemiplegia affecting nondominant side  Lack of coordination due to stroke  Left-sided weakness      Subjective Assessment - 10/31/14 0849    Patient is accompained by: Family member   Pertinent History CVA w/ residual Lt hemiparesis (Aug 2007)   Patient Stated Goals I want to work on my Lt arm   Currently in Pain? No/denies                      OT Treatments/Exercises (OP) - 10/31/14 0001    Exercises   Exercises Shoulder   Shoulder Exercises: ROM/Strengthening   UBE (Upper Arm Bike) x 8 min. Level 1 with Lt hand wrapped (for reciprocal movement)   Neurological Re-education Exercises   Shoulder Flexion AAROM;Self ROM;10 reps;Supine   Weight Bearing Position Seated   Seated with weight on forearm leaning to left side for weightbearing through left elbow min-mod v.c./ demonstration   Seated with weight on hand performing trunk rotation while reaching with RUE, weightbearing on LUE                OT Education - 10/31/14 1545    Education provided Yes   Education Details HEP review   Person(s) Educated Patient;Spouse   Methods Explanation;Demonstration   Comprehension Verbalized understanding;Returned demonstration             OT Long Term Goals - 09/30/14 1435    OT LONG TERM GOAL #1   Title Independent w/ HEP for LUE (DUE 10/31/14)   Time 4   Period Weeks   Status New   OT LONG TERM GOAL #2   Title Verbalize understanding with potential A/E needs for greater ease/safety with cooking/IADLS and how to acquire A/E   Time 4   Period Weeks   Status New   OT LONG TERM GOAL #3   Title Independent with splint wear and care for Lt thumb   Time 4   Period Weeks   Status New   OT LONG  TERM GOAL #4   Title Improve LUE function as evidenced by performing 10 blocks or more on Box & Blocks test   Baseline 5   Time 4   Period Weeks   Status New               Plan - 10/31/14 1544    Clinical Impression Statement Pt is progressing towards goals.   Pt will benefit from skilled therapeutic intervention in order to improve on the following deficits (Retired) Decreased knowledge of use of DME;Impaired flexibility;Decreased coordination;Decreased mobility;Improper body mechanics;Decreased strength;Decreased range of motion;Impaired tone;Decreased activity tolerance;Decreased endurance;Impaired UE functional use   Rehab Potential Good   OT Frequency 2x / week   OT Duration 4 weeks   OT Treatment/Interventions Self-care/ADL training;Electrical Stimulation;Therapeutic exercise;Moist Heat;Neuromuscular  education;Splinting;Fluidtherapy;Functional Mobility Training;Patient/family education;DME and/or AE instruction;Manual Therapy;Passive range of motion;Therapeutic activities;Balance training   Plan functional reaching with LUE, flipping playing cards, UBE.   OT Home Exercise Plan A/E recommendations issued 10/21/14; issued splint wear and care instructions 10/23/14; issued LUE HEP 10/28/14   Consulted and Agree with Plan of Care Patient;Family member/caregiver   Family Member Consulted spouse        Problem List Patient Active Problem List   Diagnosis Date Noted  . GI bleed 10/08/2014  . Rectal bleeding 10/07/2014  . Acute lower GI bleeding 10/07/2014  . S/P colonoscopic polypectomy 10/07/2014  . Syncope due to orthostatic hypotension 10/07/2014  . Dyspnea 08/27/2013  . Abnormal ECG 08/27/2013  . Stroke   . Hypertension   . Diabetes   . Obesity   . Hyperlipidemia     RINE,KATHRYN 10/31/2014, 3:46 PM Theone Murdoch, OTR/L [phone] Phone: [phone] 3:46 PM 10/31/2014 [facility] [address] [place], [place], [address] Phone: [phone]   Fax:  [phone]

## 2014-11-04 ENCOUNTER — Ambulatory Visit: Payer: 59 | Admitting: Physical Therapy

## 2014-11-04 ENCOUNTER — Encounter: Payer: Self-pay | Admitting: Occupational Therapy

## 2014-11-04 ENCOUNTER — Telehealth: Payer: Self-pay | Admitting: Gastroenterology

## 2014-11-04 ENCOUNTER — Ambulatory Visit: Payer: 59 | Admitting: Occupational Therapy

## 2014-11-04 DIAGNOSIS — R269 Unspecified abnormalities of gait and mobility: Secondary | ICD-10-CM

## 2014-11-04 DIAGNOSIS — IMO0002 Reserved for concepts with insufficient information to code with codable children: Secondary | ICD-10-CM

## 2014-11-04 DIAGNOSIS — R531 Weakness: Secondary | ICD-10-CM

## 2014-11-04 DIAGNOSIS — G8110 Spastic hemiplegia affecting unspecified side: Secondary | ICD-10-CM

## 2014-11-04 NOTE — Therapy (Signed)
[facility] [address] [place], [place], [address] Phone: [phone]   Fax:  [phone]  Physical Therapy Treatment  Patient Details  Name: Erica Cooper MRN: [id number] Date of Birth: 1951-03-01 Referring Provider:  Tillman Sers, MD  Encounter Date: 11/04/2014      PT End of Session - 11/04/14 0943    Visit Number 13   Number of Visits 17   Date for PT Re-Evaluation 12/07/14   Authorization Type G-code every 10th visit.   PT Start Time 0932   PT Stop Time 1015   PT Time Calculation (min) 43 min   Equipment Utilized During Treatment Gait belt   Activity Tolerance Patient tolerated treatment well   Behavior During Therapy WFL for tasks assessed/performed      Past Medical History  Diagnosis Date  . Stroke 2007    weakness left side  . Hypertension   . Diabetes     borderline/ no meds  . Obesity   . Hyperlipidemia   . GERD (gastroesophageal reflux disease)   . GI bleeding 09/2014    Past Surgical History  Procedure Laterality Date  . Colonoscopy    . Colonoscopy N/A 10/08/2014    Procedure: COLONOSCOPY;  Surgeon: Gatha Mayer, MD;  Location: [facility];  Service: Endoscopy;  Laterality: N/A;    There were no vitals filed for this visit.  Visit Diagnosis:  Left-sided weakness  Abnormality of gait      Subjective Assessment - 11/04/14 0942    Subjective Denies falls or changes.  Stated R heel wedge worked in other shoes but not wearing it today as she is wearing sandals with back strap but open heel.   Patient is accompained by: Family member   Patient Stated Goals Walk better and use L arm to cook and dress herself   Currently in Pain? No/denies              [facility] Adult PT Treatment/Exercise - 11/04/14 0944    Knee/Hip Exercises: Aerobic   Stationary Bike Scifit level 1.5 LE's only x 8 minutes for flexibility for RLE.   Knee/Hip Exercises: Standing   Functional Squat 15 reps   Functional Squat Limitations with 1 UE support in parallel bars   SLS taps to 4" step with each LE x 15 x 2 with 1 UE support   Other Standing Knee Exercises cone tipping/uprighting with LLE working on strength/ROM x 20 reps   Other Standing Knee Exercises standing on 4" step with 1 LE while performing SLR on other leg x 15 x 2 with 1 UE support in parallel bars;performed same again on 6" step with railing                PT Education - 11/04/14 1149    Education provided Yes   Education Details Bring tennis/closed heel/toe shoes next visit so can continue to assess for AFO   Person(s) Educated Patient;Spouse   Methods Explanation;Demonstration   Comprehension Verbalized understanding          PT Short Term Goals - 10/23/14 1605    PT SHORT TERM GOAL #1   Title Pt will be independent in HEP to improve strength, balance, and safety during functional mobility. Target date: 10/07/14.   Status Achieved   PT SHORT TERM GOAL #2   Title Pt will improve BERG score to >/=44/56 to decrease falls risk. Target date: 10/07/14.   Status Achieved   PT SHORT TERM GOAL #3  Title Pt will ambulate 300' over even terrain with LRAD at MOD I level to improve functional mobilty. Target date: 10/07/14.   Status Partially Met   PT SHORT TERM GOAL #4   Title Pt will perform TUG with LRAD in </=13.5 seconds to decrease falls risk. Target date: 10/07/14.   Status Not Met           PT Long Term Goals - 10/31/14 1037    PT LONG TERM GOAL #1   Title Pt will verbalize understanding of CVA symptoms and risk factors to reduce risk of future CVA. Target date: 11/04/14.   Baseline All unmet goals will b ecarried over to new POC date: 12/07/14   Status On-going   PT LONG TERM GOAL #2   Title Pt will ambulate 600' over even/uneven terrain with LRAD at MOD I level to improve functional mobility and to play with her grandchildren. Target dat: 11/04/14.   Status Not Met   PT LONG TERM GOAL #3   Title Pt will  improve BERG score to >/=48/56 to decrease falls risk. Target date: 11/04/14.   Status Achieved   PT LONG TERM GOAL #4   Title Pt will improve her gait speed with LRAD to >/=1.32ft/sec. to reduce falls risk. Target date: 11/04/14.   Status Partially Met   PT LONG TERM GOAL #5   Title Pt will improve FOTO score by 10 points to improve quality of life. Target date: 11/04/14.   Status On-going   PT LONG TERM GOAL #6   Title Pt will be able to peform reaching outside BOS activities (5 inches) while standing with feet apart for 5 minutes,without LOB, at MOD I level in order to cook at home. Target date: 11/04/14.   Status On-going               Plan - 11/04/14 1247    Clinical Impression Statement Pt with improved strength in RLE and able to clear RLE better during exercises.  Continue PT per POC.   Pt will benefit from skilled therapeutic intervention in order to improve on the following deficits Abnormal gait;Decreased endurance;Decreased knowledge of use of DME;Decreased balance;Decreased mobility;Decreased strength;Impaired UE functional use;Decreased range of motion;Other (comment)   Clinical Impairments Affecting Rehab Potential CVA was 8.5 years ago   PT Frequency 2x / week   PT Duration 8 weeks   PT Treatment/Interventions ADLs/Self Care Home Management;Gait training;Neuromuscular re-education;Stair training;Biofeedback;Functional mobility training;Patient/family education;Therapeutic activities;Electrical Stimulation;Therapeutic exercise;Manual techniques;DME Instruction;Balance training;Other (comment)   PT Next Visit Plan Continue trial of LAFO if pt brings proper shoes.  Use wedge in R shoe and simulated toe cap on L in addition to AFO.   Consulted and Agree with Plan of Care Patient   Family Member Consulted pt's husband-[name]?        Problem List Patient Active Problem List   Diagnosis Date Noted  . GI bleed 10/08/2014  . Rectal bleeding 10/07/2014  . Acute lower GI  bleeding 10/07/2014  . S/P colonoscopic polypectomy 10/07/2014  . Syncope due to orthostatic hypotension 10/07/2014  . Dyspnea 08/27/2013  . Abnormal ECG 08/27/2013  . Stroke   . Hypertension   . Diabetes   . Obesity   . Hyperlipidemia     Erica Cooper 11/04/2014, 1:01 PM  Erica Cooper [address] [place], [place], [address] Phone: [phone]   Fax:  [place], [place] 11/04/2014 1:01 PM  Phone: [phone] Fax: [phone]

## 2014-11-04 NOTE — Telephone Encounter (Signed)
Spoke with the patient and the husband. Advised of her improving CBC. Patient states she is feeling stronger. She will call us if she acutely worsens or has new/ return symptoms.

## 2014-11-04 NOTE — Therapy (Signed)
[facility] [address] [place], [place], [address] Phone: [phone]   Fax:  [phone]  Occupational Therapy Treatment  Patient Details  Name: Erica Cooper MRN: [id number] Date of Birth: [birth year] Referring Provider:  Nolene Ebbs, MD  Encounter Date: 11/04/2014      OT End of Session - 11/04/14 1058    Visit Number 6   Number of Visits 9   Date for OT Re-Evaluation 10/31/14   Authorization Type [employer] Medicare   Authorization Time Period week 3/4   Authorization - Visit Number 6   Authorization - Number of Visits 10   OT Start Time 1017   OT Stop Time 1100   OT Time Calculation (min) 43 min   Activity Tolerance Patient tolerated treatment well   Behavior During Therapy [facility] for tasks assessed/performed      Past Medical History  Diagnosis Date  . Stroke 2007    weakness left side  . Hypertension   . Diabetes     borderline/ no meds  . Obesity   . Hyperlipidemia   . GERD (gastroesophageal reflux disease)   . GI bleeding 09/2014    Past Surgical History  Procedure Laterality Date  . Colonoscopy    . Colonoscopy N/A 10/08/2014    Procedure: COLONOSCOPY;  Surgeon: Gatha Mayer, MD;  Location: [facility];  Service: Endoscopy;  Laterality: N/A;    There were no vitals filed for this visit.  Visit Diagnosis:  Spastic hemiplegia affecting nondominant side  Lack of coordination due to stroke      Subjective Assessment - 11/04/14 1057    Subjective  Pt reports that she forgot the splints today, but no problem   Patient is accompained by: Family member   Pertinent History CVA w/ residual Lt hemiparesis (Aug 2007)   Patient Stated Goals I want to work on my Lt arm   Currently in Pain? No/denies                      OT Treatments/Exercises (OP) - 11/04/14 1627    Neurological Re-education Exercises   Other Exercises 1 flipping large cards with min cues for compensation and min A  to stabilize card for grasp   Reciprocal Movements Arm bike x75min level 1 for reciprocal movement without rest   Functional Reaching Activities   Low Level in standing, to remove cylinder pegs from pegboard with min cues                OT Education - 11/04/14 1056    Education Details HEP review   Person(s) Educated Patient   Methods Explanation;Demonstration   Comprehension Verbalized understanding;Returned demonstration;Verbal cues required;Tactile cues required  min cues for positioning             OT Long Term Goals - 11/04/14 1026    OT LONG TERM GOAL #1   Title Independent w/ HEP for LUE (DUE 10/31/14)   Time 4   Period Weeks   Status On-going  11/04/14:  min cues for positioning   OT LONG TERM GOAL #2   Title Verbalize understanding with potential A/E needs for greater ease/safety with cooking/IADLS and how to acquire A/E   Time 4   Period Weeks   Status Achieved  issued 10/21/14   OT LONG TERM GOAL #3   Title Independent with splint wear and care for Lt thumb   Time 4   Period Weeks   Status Achieved  OT LONG TERM GOAL #4   Title Improve LUE function as evidenced by performing 10 blocks or more on Box & Blocks test   Baseline 5   Time 4   Period Weeks   Status On-going  11/04/14:  6 blocks               Plan - 11/04/14 1059    Clinical Impression Statement Pt is progressing towards goals, will extend goals as pt has not been seen frequency.   Plan functional reaching, neuro re-ed.   Consulted and Agree with Plan of Care Patient;Family member/caregiver   Family Member Consulted spouse        Problem List Patient Active Problem List   Diagnosis Date Noted  . GI bleed 10/08/2014  . Rectal bleeding 10/07/2014  . Acute lower GI bleeding 10/07/2014  . S/P colonoscopic polypectomy 10/07/2014  . Syncope due to orthostatic hypotension 10/07/2014  . Dyspnea 08/27/2013  . Abnormal ECG 08/27/2013  . Stroke   . Hypertension   . Diabetes   .  Obesity   . Hyperlipidemia     [facility] 11/04/2014, 4:31 PM  [facility] [address] [place], [place], [address] Phone: [phone]   Fax:  [place], OTR/L 11/04/2014 4:31 PM

## 2014-11-07 ENCOUNTER — Ambulatory Visit: Payer: 59 | Admitting: Occupational Therapy

## 2014-11-07 ENCOUNTER — Encounter: Payer: Self-pay | Admitting: Occupational Therapy

## 2014-11-07 ENCOUNTER — Telehealth: Payer: Self-pay | Admitting: Physical Therapy

## 2014-11-07 ENCOUNTER — Ambulatory Visit: Payer: 59 | Admitting: Physical Therapy

## 2014-11-07 DIAGNOSIS — R269 Unspecified abnormalities of gait and mobility: Secondary | ICD-10-CM | POA: Diagnosis not present

## 2014-11-07 DIAGNOSIS — G8110 Spastic hemiplegia affecting unspecified side: Secondary | ICD-10-CM

## 2014-11-07 NOTE — Therapy (Signed)
[facility] [address] [place], [place], [address] Phone: [phone]   Fax:  [phone]  Physical Therapy Treatment  Patient Details  Name: Erica Cooper MRN: [id number] Date of Birth: 05-20-1951 Referring Provider:  Tillman Sers, MD  Encounter Date: 11/07/2014      PT End of Session - 11/07/14 1029    Visit Number 14   Number of Visits 17   Date for PT Re-Evaluation 12/07/14   Authorization Type G-code every 10th visit.   PT Start Time [phone]   PT Stop Time 1018   PT Time Calculation (min) 47 min   Activity Tolerance Patient tolerated treatment well   Behavior During Therapy [facility] for tasks assessed/performed      Past Medical History  Diagnosis Date  . Stroke 2007    weakness left side  . Hypertension   . Diabetes     borderline/ no meds  . Obesity   . Hyperlipidemia   . GERD (gastroesophageal reflux disease)   . GI bleeding 09/2014    Past Surgical History  Procedure Laterality Date  . Colonoscopy    . Colonoscopy N/A 10/08/2014    Procedure: COLONOSCOPY;  Surgeon: Gatha Mayer, MD;  Location: [facility];  Service: Endoscopy;  Laterality: N/A;    There were no vitals filed for this visit.  Visit Diagnosis:  Abnormality of gait  Spastic hemiplegia affecting nondominant side      Subjective Assessment - 11/07/14 0935    Subjective Pt reports perception that balance is "a little better." No falls to teport. Reports improvement using R heel wedge for walking at home but not wearing wedge today. When AFO's trialed during today's session, pt expressed strong preference for L Foot Up brace due to pt comfort.    Patient is accompained by: Family member   Pertinent History CVA in 2007, Diabetes per medical chart, HTN   Patient Stated Goals Walk better and use L arm to cook and dress herself   Currently in Pain? No/denies       Treatment   Gait Training: - During gait x115' over indoor, level  surfaces, pt demonstrates the following gait deviations: decreased LLE hip/knee flexion and L ankle dorsiflexion during LLE advancement; limited lateral weight shift to L side, decreased trunk rotation, trunk extension during L pre/initial swing (appears to be secondary to limited LLE hip/knee flexion), increased trunk flexion during L terminal stance (possible compensatory strategy for pt difficulty eccentrically controlling L ankle dorsiflexion); limited L arm swing. Pt wearing zip-up athletic shoes during this session.  Trialed L Toe Off AFO and L Foot Up brace (due to pt-stated preference to wear "smaller" brace. Pt exhibited the following self-selected gait speed, changes in gait impairments under each condition: - Without orthotic, self-selected gait speed = 1.44 ft/sec; see above for gait impairments. - Gait speed with L Toe Off: 1.47 ft/sec; increased L ankle dorsiflexion during LLE advancement; less prominent trunk flexion during L terminal stance; less trunk extension during pre/initial swing. Noted minimal LLE circumduction. - Gait speed with L Foot Up brace: 1.74 ft/sec. Increased L ankle dorsiflexion; less prominent trunk extension during pre/initial swing.  Unable to trial [place] Reaction (none of pt size in clinic) but anticipate loading mechanism of Reaction might improve L hip/knee flexion during LLE advancement.  Self Care: - Per discussion with pt/husband, recommending L Foot Up brace for increased gait stability, decrease fall risk. See Patient Education for further detail.  PT Education - 11/07/14 1028    Education provided Yes   Education Details Recommendation for L Foot Up brace due to improved self-selected gait speed as well as pt comfort (as compared with carbon fiber AFO). Recommending pt wear lace up athletic shoes to remaining sessions, if possible.   Person(s) Educated Patient;Spouse   Methods Explanation;Demonstration;Handout   Comprehension Verbalized  understanding          PT Short Term Goals - 10/23/14 1605    PT SHORT TERM GOAL #1   Title Pt will be independent in HEP to improve strength, balance, and safety during functional mobility. Target date: 10/07/14.   Status Achieved   PT SHORT TERM GOAL #2   Title Pt will improve BERG score to >/=44/56 to decrease falls risk. Target date: 10/07/14.   Status Achieved   PT SHORT TERM GOAL #3   Title Pt will ambulate 300' over even terrain with LRAD at MOD I level to improve functional mobilty. Target date: 10/07/14.   Status Partially Met   PT SHORT TERM GOAL #4   Title Pt will perform TUG with LRAD in </=13.5 seconds to decrease falls risk. Target date: 10/07/14.   Status Not Met           PT Long Term Goals - 10/31/14 1037    PT LONG TERM GOAL #1   Title Pt will verbalize understanding of CVA symptoms and risk factors to reduce risk of future CVA. Target date: 11/04/14.   Baseline All unmet goals will b ecarried over to new POC date: 12/07/14   Status On-going   PT LONG TERM GOAL #2   Title Pt will ambulate 600' over even/uneven terrain with LRAD at MOD I level to improve functional mobility and to play with her grandchildren. Target dat: 11/04/14.   Status Not Met   PT LONG TERM GOAL #3   Title Pt will improve BERG score to >/=48/56 to decrease falls risk. Target date: 11/04/14.   Status Achieved   PT LONG TERM GOAL #4   Title Pt will improve her gait speed with LRAD to >/=1.16f/sec. to reduce falls risk. Target date: 11/04/14.   Status Partially Met   PT LONG TERM GOAL #5   Title Pt will improve FOTO score by 10 points to improve quality of life. Target date: 11/04/14.   Status On-going   PT LONG TERM GOAL #6   Title Pt will be able to peform reaching outside BOS activities (5 inches) while standing with feet apart for 5 minutes,without LOB, at MOD I level in order to cook at home. Target date: 11/04/14.   Status On-going               Plan - 11/07/14 1030    Clinical  Impression Statement After trial of L AFO (Toe Off) and L Foot Up brace, noted LLE clearance improved, self-selected gait speed increased with both (gait speed with Foot Up > Toe Off).  Due to pt comfort/preference and improved gait speed/mechanics, will  request MD order for L Foot Up brace. Continue per POC.   Pt will benefit from skilled therapeutic intervention in order to improve on the following deficits Abnormal gait;Decreased endurance;Decreased knowledge of use of DME;Decreased balance;Decreased mobility;Decreased strength;Impaired UE functional use;Decreased range of motion;Other (comment)   Rehab Potential Fair   Clinical Impairments Affecting Rehab Potential CVA was 8.5 years ago   PT Frequency 2x / week   PT Duration 8 weeks   PT Treatment/Interventions ADLs/Self  Care Home Management;Gait training;Neuromuscular re-education;Stair training;Biofeedback;Functional mobility training;Patient/family education;Therapeutic activities;Electrical Stimulation;Therapeutic exercise;Manual techniques;DME Instruction;Balance training;Other (comment)   PT Next Visit Plan Follow up to see if MD order for L Foot Up brace received. Continue gait training.   Consulted and Agree with Plan of Care Patient;Family member/caregiver   Family Member Consulted pt's husband-[name]?        Problem List Patient Active Problem List   Diagnosis Date Noted  . GI bleed 10/08/2014  . Rectal bleeding 10/07/2014  . Acute lower GI bleeding 10/07/2014  . S/P colonoscopic polypectomy 10/07/2014  . Syncope due to orthostatic hypotension 10/07/2014  . Dyspnea 08/27/2013  . Abnormal ECG 08/27/2013  . Stroke   . Hypertension   . Diabetes   . Obesity   . Hyperlipidemia     Billie Ruddy, PT, DPT [facility] [address] [place], [place], [address] Phone: [phone]   Fax:  [phone] 11/07/2014, 10:50 AM

## 2014-11-07 NOTE — Therapy (Signed)
[facility] [address] [place], [place], [address] Phone: [phone]   Fax:  [phone]  Occupational Therapy Treatment  Patient Details  Name: Erica Cooper MRN: [id number] Date of Birth: [birth year] Referring Provider:  Nolene Ebbs, MD  Encounter Date: 11/07/2014      OT End of Session - 11/07/14 0949    Date for OT Re-Evaluation 11/14/14      Past Medical History  Diagnosis Date  . Stroke 2007    weakness left side  . Hypertension   . Diabetes     borderline/ no meds  . Obesity   . Hyperlipidemia   . GERD (gastroesophageal reflux disease)   . GI bleeding 09/2014    Past Surgical History  Procedure Laterality Date  . Colonoscopy    . Colonoscopy N/A 10/08/2014    Procedure: COLONOSCOPY;  Surgeon: Gatha Mayer, MD;  Location: [facility];  Service: Endoscopy;  Laterality: N/A;    There were no vitals filed for this visit.  Visit Diagnosis:  Spastic hemiplegia affecting nondominant side      Subjective Assessment - 11/07/14 0932    Subjective  Patient indicates splints are helpful to manage left thumb   Patient is accompained by: Family member   Pertinent History CVA w/ residual Lt hemiparesis (Aug 2007)   Patient Stated Goals I want to work on my Lt arm   Currently in Pain? No/denies   Multiple Pain Sites No                      OT Treatments/Exercises (OP) - 11/07/14 0001    Neurological Re-education Exercises   Other Exercises 1 Neuromuscular reeducation to left upper extremity to address overactive shoulder internal rotation and elbow flexion for all attempts at movement.  Weight bearing on long arm with sitting - lateral weight shifts, and weight shifts designed to stretch all aspects of hand and digits with active elbow extension.  Weight bearing in quadruped with emphasis on forced use of active left UE with active weight shifts.  Followed with standing and step patterns  with emphasis on maintaining passive position of left arm, versus active internal rotation and elbow flexion with each LE step.   .   Other Exercises 2 standing at counter top practiced forward reach, grasp and release with verbal and physical cueing for proper mechanics for reach.  Patient by the end able to inhibit pectoralis / biceps to reach with shoulder flexion and active elbow extension.  IP flexion in thumb reduced following weight bearing, however, with any extraneosu effort, thumb IP joint flexed,.                   OT Education - 11/07/14 0945    Education provided Yes   Education Details Aactive LE during walking, helps to relax arm overall   Person(s) Educated Patient;Spouse   Methods Explanation;Demonstration   Comprehension Verbalized understanding;Verbal cues required             OT Long Term Goals - 11/04/14 1026    OT LONG TERM GOAL #1   Title Independent w/ HEP for LUE (DUE 10/31/14)   Time 4   Period Weeks   Status On-going  11/04/14:  min cues for positioning   OT LONG TERM GOAL #2   Title Verbalize understanding with potential A/E needs for greater ease/safety with cooking/IADLS and how to acquire A/E   Time 4   Period Weeks  Status Achieved  issued 10/21/14   OT LONG TERM GOAL #3   Title Independent with splint wear and care for Lt thumb   Time 4   Period Weeks   Status Achieved   OT LONG TERM GOAL #4   Title Improve LUE function as evidenced by performing 10 blocks or more on Box & Blocks test   Baseline 5   Time 4   Period Weeks   Status On-going  11/04/14:  6 blocks               Plan - 11/07/14 0947    Clinical Impression Statement Patient shows potential to meet all long term goals   Pt will benefit from skilled therapeutic intervention in order to improve on the following deficits (Retired) Decreased knowledge of use of DME;Impaired flexibility;Decreased coordination;Decreased mobility;Improper body mechanics;Decreased  strength;Decreased range of motion;Impaired tone;Decreased activity tolerance;Decreased endurance;Impaired UE functional use   Rehab Potential Good   OT Frequency 2x / week   OT Duration 4 weeks   OT Treatment/Interventions Self-care/ADL training;Electrical Stimulation;Therapeutic exercise;Moist Heat;Neuromuscular education;Splinting;Fluidtherapy;Functional Mobility Training;Patient/family education;DME and/or AE instruction;Manual Therapy;Passive range of motion;Therapeutic activities;Balance training   Plan functional reachin, neuro reed, goal check   OT Home Exercise Plan A/E recommendations issued 10/21/14; issued splint wear and care instructions 10/23/14; issued LUE HEP 10/28/14   Consulted and Agree with Plan of Care Patient;Family member/caregiver        Problem List Patient Active Problem List   Diagnosis Date Noted  . GI bleed 10/08/2014  . Rectal bleeding 10/07/2014  . Acute lower GI bleeding 10/07/2014  . S/P colonoscopic polypectomy 10/07/2014  . Syncope due to orthostatic hypotension 10/07/2014  . Dyspnea 08/27/2013  . Abnormal ECG 08/27/2013  . Stroke   . Hypertension   . Diabetes   . Obesity   . Hyperlipidemia     Mariah Milling, OTR/L 11/07/2014, 9:51 AM  [facility] [address] [place], [place], [address] Phone: [phone]   Fax:  [phone]

## 2014-11-07 NOTE — Telephone Encounter (Signed)
Patient would benefit from a Foot Up brace to increase left foot clearance during ambulation and decrease fall risk. If you agree, please submit an order for a Foot Up brace.  Thank you, Billie Ruddy, PT, DPT [facility] [address] [place], [place], [address] Phone: [phone]   Fax:  [phone] 11/07/2014, 7:16 PM

## 2014-11-11 ENCOUNTER — Ambulatory Visit: Payer: 59

## 2014-11-11 ENCOUNTER — Ambulatory Visit: Payer: 59 | Admitting: Occupational Therapy

## 2014-11-11 ENCOUNTER — Encounter: Payer: Self-pay | Admitting: Occupational Therapy

## 2014-11-11 DIAGNOSIS — R269 Unspecified abnormalities of gait and mobility: Secondary | ICD-10-CM

## 2014-11-11 DIAGNOSIS — R531 Weakness: Secondary | ICD-10-CM

## 2014-11-11 DIAGNOSIS — G8110 Spastic hemiplegia affecting unspecified side: Secondary | ICD-10-CM

## 2014-11-11 NOTE — Therapy (Signed)
[facility] [address] [place], [place], [address] Phone: [phone]   Fax:  [phone]  Occupational Therapy Treatment  Patient Details  Name: Erica Cooper MRN: [id number] Date of Birth: 1950-08-09 Referring Provider:  Nolene Ebbs, MD  Encounter Date: 11/11/2014      OT End of Session - 11/11/14 1242    Visit Number 8   Number of Visits 9   Date for OT Re-Evaluation 11/14/14   Authorization Type [employer]   OT Start Time 1016   OT Stop Time 1058   OT Time Calculation (min) 42 min   Activity Tolerance Patient tolerated treatment well      Past Medical History  Diagnosis Date  . Stroke 2007    weakness left side  . Hypertension   . Diabetes     borderline/ no meds  . Obesity   . Hyperlipidemia   . GERD (gastroesophageal reflux disease)   . GI bleeding 09/2014    Past Surgical History  Procedure Laterality Date  . Colonoscopy    . Colonoscopy N/A 10/08/2014    Procedure: COLONOSCOPY;  Surgeon: Gatha Mayer, MD;  Location: [facility];  Service: Endoscopy;  Laterality: N/A;    There were no vitals filed for this visit.  Visit Diagnosis:  Spastic hemiplegia affecting nondominant side  Left-sided weakness      Subjective Assessment - 11/11/14 1016    Subjective  I wear my splints except when I sleep and take a bath   Patient is accompained by: Family member   Pertinent History CVA w/ residual Lt hemiparesis (Aug 2007)   Patient Stated Goals I want to work on my Lt arm   Currently in Pain? No/denies                      OT Treatments/Exercises (OP) - 11/11/14 0001    Neurological Re-education Exercises   Other Exercises 1 Neuro re ed to address LUE movement in sitting and standing with emphasis on closed chain activities and facilitating more ER, elbow extension with wrist in either neutral or extension.  Also emphasized decreasing shoulder hike (pt hikes shoulder for all  RUE movement).  Pt able to take resistance in closed chain low level reach activities and demonstrated improved isolated movement during session.                     OT Long Term Goals - 11/11/14 1241    OT LONG TERM GOAL #1   Title Independent w/ HEP for LUE (DUE 10/31/14)   Time 4   Period Weeks   Status On-going  11/04/14:  min cues for positioning   OT LONG TERM GOAL #2   Title Verbalize understanding with potential A/E needs for greater ease/safety with cooking/IADLS and how to acquire A/E   Time 4   Period Weeks   Status Achieved  issued 10/21/14   OT LONG TERM GOAL #3   Title Independent with splint wear and care for Lt thumb   Time 4   Period Weeks   Status Achieved   OT LONG TERM GOAL #4   Title Improve LUE function as evidenced by performing 10 blocks or more on Box & Blocks test   Baseline 5   Time 4   Period Weeks   Status On-going  11/04/14:  6 blocks               Plan - 11/11/14 1241  Clinical Impression Statement Pt is progressing toward goals. Pt is very motivated and eager to work on improving RUE functioning.   Pt will benefit from skilled therapeutic intervention in order to improve on the following deficits (Retired) Decreased knowledge of use of DME;Impaired flexibility;Decreased coordination;Decreased mobility;Improper body mechanics;Decreased strength;Decreased range of motion;Impaired tone;Decreased activity tolerance;Decreased endurance;Impaired UE functional use   Rehab Potential Good   OT Frequency 2x / week   OT Duration 4 weeks   OT Treatment/Interventions Self-care/ADL training;Electrical Stimulation;Therapeutic exercise;Moist Heat;Neuromuscular education;Splinting;Fluidtherapy;Functional Mobility Training;Patient/family education;DME and/or AE instruction;Manual Therapy;Passive range of motion;Therapeutic activities;Balance training   Plan check goals, neuro re ed   OT Home Exercise Plan A/E recommendations issued 10/21/14; issued  splint wear and care instructions 10/23/14; issued LUE HEP 10/28/14   Consulted and Agree with Plan of Care Patient;Family member/caregiver   Family Member Consulted spouse        Problem List Patient Active Problem List   Diagnosis Date Noted  . GI bleed 10/08/2014  . Rectal bleeding 10/07/2014  . Acute lower GI bleeding 10/07/2014  . S/P colonoscopic polypectomy 10/07/2014  . Syncope due to orthostatic hypotension 10/07/2014  . Dyspnea 08/27/2013  . Abnormal ECG 08/27/2013  . Stroke   . Hypertension   . Diabetes   . Obesity   . Hyperlipidemia     Quay Burow, OTR/L 11/11/2014, 12:43 PM  [name] [address] [place], [place], [address] Phone: [phone]   Fax:  [phone]

## 2014-11-11 NOTE — Therapy (Signed)
[facility] [address] [place], [place], [address] Phone: [phone]   Fax:  [phone]  Physical Therapy Treatment  Patient Details  Name: Erica Cooper MRN: [id number] Date of Birth: 15-Jul-1950 Referring Provider:  Fleet Contras, MD  Encounter Date: 11/11/2014      PT End of Session - 11/11/14 1252    Visit Number 15   Number of Visits 25   Date for PT Re-Evaluation 12/07/14   Authorization Type G-code every 10th visit.   PT Start Time [phone]   PT Stop Time 1012   PT Time Calculation (min) 41 min   Equipment Utilized During Treatment Gait belt   Activity Tolerance Patient tolerated treatment well   Behavior During Therapy WFL for tasks assessed/performed      Past Medical History  Diagnosis Date  . Stroke 2007    weakness left side  . Hypertension   . Diabetes     borderline/ no meds  . Obesity   . Hyperlipidemia   . GERD (gastroesophageal reflux disease)   . GI bleeding 09/2014    Past Surgical History  Procedure Laterality Date  . Colonoscopy    . Colonoscopy N/A 10/08/2014    Procedure: COLONOSCOPY;  Surgeon: Iva Boop, MD;  Location: [facility] ENDOSCOPY;  Service: Endoscopy;  Laterality: N/A;    There were no vitals filed for this visit.  Visit Diagnosis:  Abnormality of gait  Left-sided weakness      Subjective Assessment - 11/11/14 0935    Subjective Pt reported no falls or changes since last session.   Patient is accompained by: Family member   Pertinent History CVA in 2007, Diabetes per medical chart, HTN   Patient Stated Goals Walk better and use L arm to cook and dress herself   Currently in Pain? No/denies                         [facility] Adult PT Treatment/Exercise - 11/11/14 0937    Ambulation/Gait   Ambulation/Gait Yes   Ambulation/Gait Assistance 5: Supervision   Ambulation/Gait Assistance Details Pt ambulated with and without L foot up brace and  lace tennis  shoes. Cues for sequencing with SPC and to reduce L UE flexion. Pt required min guard during turns due to increased postural sway.   Ambulation Distance (Feet) --  300' without AD, 500' with SPC all outdoors, 200' indoors   Assistive device Straight cane;None   Gait Pattern Step-through pattern;Decreased dorsiflexion - left;Decreased hip/knee flexion - left;Decreased stride length;Left circumduction;Wide base of support;Decreased trunk rotation   Ambulation Surface Level;Unlevel;Indoor;Outdoor;Grass   Curb 5: Supervision;Other (comment)  min guard   Curb Details (indicate cue type and reason) x3. VC's and demonstration for sequencing with SPC. pt progressed to supervision.                PT Education - 11/11/14 1252    Education provided Yes   Education Details PT educated pt on the importance of using SPC in the community for safety (especially over uneven terrain)   Person(s) Educated Patient;Spouse   Methods Explanation   Comprehension Verbalized understanding          PT Short Term Goals - 10/23/14 1605    PT SHORT TERM GOAL #1   Title Pt will be independent in HEP to improve strength, balance, and safety during functional mobility. Target date: 10/07/14.   Status Achieved   PT SHORT TERM GOAL #2  Title Pt will improve BERG score to >/=44/56 to decrease falls risk. Target date: 10/07/14.   Status Achieved   PT SHORT TERM GOAL #3   Title Pt will ambulate 300' over even terrain with LRAD at MOD I level to improve functional mobilty. Target date: 10/07/14.   Status Partially Met   PT SHORT TERM GOAL #4   Title Pt will perform TUG with LRAD in </=13.5 seconds to decrease falls risk. Target date: 10/07/14.   Status Not Met           PT Long Term Goals - 11/11/14 1255    PT LONG TERM GOAL #1   Title Pt will verbalize understanding of CVA symptoms and risk factors to reduce risk of future CVA. Target date: 12/07/14.   Status On-going   PT LONG TERM GOAL #2   Title Pt  will ambulate 600' over even/uneven terrain with LRAD at MOD I level to improve functional mobility and to play with her grandchildren. Target dat: 12/07/14.   Status On-going   PT LONG TERM GOAL #3   Title Pt will improve BERG score to >/=48/56 to decrease falls risk. Target date: 11/04/14.   Status Achieved   PT LONG TERM GOAL #4   Title Pt will improve her gait speed with LRAD to >/=1.42ft/sec. to reduce falls risk. Target date: 12/07/14.   Status On-going   PT LONG TERM GOAL #5   Title Pt will improve FOTO score by 10 points to improve quality of life. Target date: 12/07/14.   Status On-going   PT LONG TERM GOAL #6   Title Pt will be able to peform reaching outside BOS activities (5 inches) while standing with feet apart for 5 minutes,without LOB, at MOD I level in order to cook at home. Target date: 12/07/14.   Status On-going               Plan - 11/11/14 1253    Clinical Impression Statement Pt demonstrated progress, as she was able to decrease L UE flexion during ambulation over even terrain. However, pt continues to experience L UE synergy during amb. over uneven surfaces. Pt continues to require cues for sequencing with SPC. Pt demonstrated improved L foot clearance with L foot up brace. Continue with POC.   Pt will benefit from skilled therapeutic intervention in order to improve on the following deficits Abnormal gait;Decreased endurance;Decreased knowledge of use of DME;Decreased balance;Decreased mobility;Decreased strength;Impaired UE functional use;Decreased range of motion;Other (comment)   Rehab Potential Good   Clinical Impairments Affecting Rehab Potential CVA was 8.5 years ago   PT Frequency 2x / week   PT Duration 8 weeks   PT Treatment/Interventions ADLs/Self Care Home Management;Gait training;Neuromuscular re-education;Stair training;Biofeedback;Functional mobility training;Patient/family education;Therapeutic activities;Electrical Stimulation;Therapeutic  exercise;Manual techniques;DME Instruction;Balance training;Other (comment)   PT Next Visit Plan Follow up to see if MD order for L Foot Up brace received (was not in system on 11/11/14). Continue gait training.   Consulted and Agree with Plan of Care Patient;Family member/caregiver        Problem List Patient Active Problem List   Diagnosis Date Noted  . GI bleed 10/08/2014  . Rectal bleeding 10/07/2014  . Acute lower GI bleeding 10/07/2014  . S/P colonoscopic polypectomy 10/07/2014  . Syncope due to orthostatic hypotension 10/07/2014  . Dyspnea 08/27/2013  . Abnormal ECG 08/27/2013  . Stroke   . Hypertension   . Diabetes   . Obesity   . Hyperlipidemia     Jguadalupe Opiela L  11/11/2014, 12:56 PM  [facility] [address] [place], [place], [address] Phone: [phone]   Fax:  [phone]     Geoffry Paradise, PT,DPT 11/11/2014 12:56 PM Phone: [phone] Fax: [phone]

## 2014-11-13 ENCOUNTER — Encounter: Payer: 59 | Admitting: Occupational Therapy

## 2014-11-13 ENCOUNTER — Ambulatory Visit: Payer: 59 | Admitting: Physical Therapy

## 2014-11-13 DIAGNOSIS — R269 Unspecified abnormalities of gait and mobility: Secondary | ICD-10-CM

## 2014-11-13 NOTE — Patient Instructions (Signed)
Hip Flexor Stretch   Lying on back near edge of bed, bend one leg, foot flat. Hang left leg over edge, relaxed, thigh resting entirely on bed for 60 seconds. Repeat 2 times. Do 3 sessions per day. Advanced Exercise: Bend knee back keeping thigh in contact with bed.  http://gt2.exer.us/347   Copyright  VHI. All rights reserved.

## 2014-11-16 NOTE — Therapy (Signed)
[facility] [address] [place], [place], [address] Phone: [phone]   Fax:  [phone]  Physical Therapy Treatment  Patient Details  Name: Erica Cooper MRN: [id number] Date of Birth: 02/22/1951 Referring Provider:  Nolene Ebbs, MD  Encounter Date: 11/13/2014      PT End of Session - 11/16/14 1626    Visit Number 16   Number of Visits 25   Date for PT Re-Evaluation 12/07/14   Authorization Type G-code every 10th visit.   PT Start Time [phone]   PT Stop Time 1017   PT Time Calculation (min) 44 min   Equipment Utilized During Treatment Gait belt   Activity Tolerance Patient tolerated treatment well   Behavior During Therapy WFL for tasks assessed/performed      Past Medical History  Diagnosis Date  . Stroke 2007    weakness left side  . Hypertension   . Diabetes     borderline/ no meds  . Obesity   . Hyperlipidemia   . GERD (gastroesophageal reflux disease)   . GI bleeding 09/2014    Past Surgical History  Procedure Laterality Date  . Colonoscopy    . Colonoscopy N/A 10/08/2014    Procedure: COLONOSCOPY;  Surgeon: Gatha Mayer, MD;  Location: [facility];  Service: Endoscopy;  Laterality: N/A;    There were no vitals filed for this visit.  Visit Diagnosis:  Abnormality of gait   Performed and educated pt on L hip flexor stretch off edge of bed.  Also performed Rsidelying L hip flexor stretch.  Gait training on level ground with foot up brace and cues to avoid L hip hike.  Pt with 3 episodes of LOB due to L foot catching but recovered without assist.  Pt ambulated x 330 x 2, 220 x 1, 120 x 2.        PT Education - 11/16/14 1624    Education provided Yes   Education Details Hip flexor stretch   Person(s) Educated Patient;Spouse   Methods Explanation;Demonstration;Handout   Comprehension Verbalized understanding          PT Short Term Goals - 10/23/14 1605    PT SHORT TERM GOAL #1    Title Pt will be independent in HEP to improve strength, balance, and safety during functional mobility. Target date: 10/07/14.   Status Achieved   PT SHORT TERM GOAL #2   Title Pt will improve BERG score to >/=44/56 to decrease falls risk. Target date: 10/07/14.   Status Achieved   PT SHORT TERM GOAL #3   Title Pt will ambulate 300' over even terrain with LRAD at MOD I level to improve functional mobilty. Target date: 10/07/14.   Status Partially Met   PT SHORT TERM GOAL #4   Title Pt will perform TUG with LRAD in </=13.5 seconds to decrease falls risk. Target date: 10/07/14.   Status Not Met           PT Long Term Goals - 11/11/14 1255    PT LONG TERM GOAL #1   Title Pt will verbalize understanding of CVA symptoms and risk factors to reduce risk of future CVA. Target date: 12/07/14.   Status On-going   PT LONG TERM GOAL #2   Title Pt will ambulate 600' over even/uneven terrain with LRAD at MOD I level to improve functional mobility and to play with her grandchildren. Target dat: 12/07/14.   Status On-going   PT LONG TERM GOAL #3   Title Pt  will improve BERG score to >/=48/56 to decrease falls risk. Target date: 11/04/14.   Status Achieved   PT LONG TERM GOAL #4   Title Pt will improve her gait speed with LRAD to >/=1.44f/sec. to reduce falls risk. Target date: 12/07/14.   Status On-going   PT LONG TERM GOAL #5   Title Pt will improve FOTO score by 10 points to improve quality of life. Target date: 12/07/14.   Status On-going   PT LONG TERM GOAL #6   Title Pt will be able to peform reaching outside BOS activities (5 inches) while standing with feet apart for 5 minutes,without LOB, at MOD I level in order to cook at home. Target date: 12/07/14.   Status On-going               Plan - 11/16/14 1627    Clinical Impression Statement Pt continues with LUE synergy with ambulation.  Cues to avoid L hip hike and circumduction.  Continue PT per POC.   Pt will benefit from skilled  therapeutic intervention in order to improve on the following deficits Abnormal gait;Decreased endurance;Decreased knowledge of use of DME;Decreased balance;Decreased mobility;Decreased strength;Impaired UE functional use;Decreased range of motion;Other (comment)   Rehab Potential Good   Clinical Impairments Affecting Rehab Potential CVA was 8.5 years ago   PT Frequency 2x / week   PT Duration 8 weeks   PT Treatment/Interventions ADLs/Self Care Home Management;Gait training;Neuromuscular re-education;Stair training;Biofeedback;Functional mobility training;Patient/family education;Therapeutic activities;Electrical Stimulation;Therapeutic exercise;Manual techniques;DME Instruction;Balance training;Other (comment)   PT Next Visit Plan Provide info on obtaining foot up brace online.  Continue gait.   Consulted and Agree with Plan of Care Patient;Family member/caregiver   Family Member Consulted pt's husband-[name]?        Problem List Patient Active Problem List   Diagnosis Date Noted  . GI bleed 10/08/2014  . Rectal bleeding 10/07/2014  . Acute lower GI bleeding 10/07/2014  . S/P colonoscopic polypectomy 10/07/2014  . Syncope due to orthostatic hypotension 10/07/2014  . Dyspnea 08/27/2013  . Abnormal ECG 08/27/2013  . Stroke   . Hypertension   . Diabetes   . Obesity   . Hyperlipidemia     RNarda Bonds6/19/2016, 4:31 PM  [facility] [place] [address]: [phone]  Fax:  3[place] PAnderson06/19/2016 4:31 PM Phone: [phone]: [phone]

## 2014-11-17 ENCOUNTER — Ambulatory Visit: Payer: 59 | Admitting: Occupational Therapy

## 2014-11-18 ENCOUNTER — Encounter: Payer: Self-pay | Admitting: Occupational Therapy

## 2014-11-18 ENCOUNTER — Ambulatory Visit: Payer: 59

## 2014-11-18 ENCOUNTER — Ambulatory Visit: Payer: 59 | Admitting: Occupational Therapy

## 2014-11-18 DIAGNOSIS — R531 Weakness: Secondary | ICD-10-CM

## 2014-11-18 DIAGNOSIS — G8110 Spastic hemiplegia affecting unspecified side: Secondary | ICD-10-CM

## 2014-11-18 DIAGNOSIS — R269 Unspecified abnormalities of gait and mobility: Secondary | ICD-10-CM | POA: Diagnosis not present

## 2014-11-18 DIAGNOSIS — IMO0002 Reserved for concepts with insufficient information to code with codable children: Secondary | ICD-10-CM

## 2014-11-18 NOTE — Therapy (Signed)
[facility] [address] [place], [place], [address] Phone: [phone]   Fax:  [phone]  Occupational Therapy Treatment  Patient Details  Name: Erica Cooper MRN: [id number] Date of Birth: Apr 07, 1951 Referring Provider:  Nolene Ebbs, MD  Encounter Date: 11/18/2014      OT End of Session - 11/18/14 1746    Visit Number 9   Number of Visits 9   Date for OT Re-Evaluation 11/14/14   Authorization Type [employer]   OT Start Time 1615   OT Stop Time 1659   OT Time Calculation (min) 44 min   Activity Tolerance Patient tolerated treatment well      Past Medical History  Diagnosis Date  . Stroke 2007    weakness left side  . Hypertension   . Diabetes     borderline/ no meds  . Obesity   . Hyperlipidemia   . GERD (gastroesophageal reflux disease)   . GI bleeding 09/2014    Past Surgical History  Procedure Laterality Date  . Colonoscopy    . Colonoscopy N/A 10/08/2014    Procedure: COLONOSCOPY;  Surgeon: Gatha Mayer, MD;  Location: [facility];  Service: Endoscopy;  Laterality: N/A;    There were no vitals filed for this visit.  Visit Diagnosis:  Spastic hemiplegia affecting nondominant side  Left-sided weakness  Lack of coordination due to stroke      Subjective Assessment - 11/18/14 1618    Subjective  The splints are helping my hand.    Patient is accompained by: Family member   Pertinent History CVA w/ residual Lt hemiparesis (Aug 2007)   Patient Stated Goals I want to work on my Lt arm   Currently in Pain? No/denies                      OT Treatments/Exercises (OP) - 11/18/14 0001    Functional Reaching Activities   Low Level Assessed remaining LTG's'  upgraded pt's HEP to include additional activities on how to incorporate LUE into tasks; instructed on assisted reach, using LUE to hold and drink from a cup, using LUE to bathe, and using LUE to wash counters and tables.  Pt practiced all activities.  Pt requires significant instruction, repetition and reinforcement for instructions. Pt's husband present and able to verbalize understanding. He will cue pt at home.                OT Education - 11/18/14 1744    Education provided Yes   Education Details upgraded HEP   Person(s) Educated Patient;Spouse   Methods Explanation;Demonstration;Tactile cues;Verbal cues;Handout   Comprehension Verbalized understanding;Returned demonstration  husband and pt together             OT Long Term Goals - 11/18/14 1745    OT LONG TERM GOAL #1   Title Independent w/ HEP for LUE (DUE 10/31/14)   Time 4   Period Weeks   Status Achieved  11/04/14:  min cues for positioning   OT LONG TERM GOAL #2   Title Verbalize understanding with potential A/E needs for greater ease/safety with cooking/IADLS and how to acquire A/E   Time 4   Period Weeks   Status Achieved  issued 10/21/14   OT LONG TERM GOAL #3   Title Independent with splint wear and care for Lt thumb   Time 4   Period Weeks   Status Achieved   OT LONG TERM GOAL #4   Title  Improve LUE function as evidenced by performing 10 blocks or more on Box & Blocks test   Baseline 5   Time 4   Period Weeks   Status Achieved  In standing, 11 blocks               Plan - 11/18/14 1745    Clinical Impression Statement Pt has made good progress toward goals. Pt and husband very appreciative of upgraded HEP   Pt will benefit from skilled therapeutic intervention in order to improve on the following deficits (Retired) Decreased knowledge of use of DME;Impaired flexibility;Decreased coordination;Decreased mobility;Improper body mechanics;Decreased strength;Decreased range of motion;Impaired tone;Decreased activity tolerance;Decreased endurance;Impaired UE functional use   Rehab Potential Good   OT Frequency 2x / week   OT Duration 4 weeks   OT Treatment/Interventions Self-care/ADL training;Electrical  Stimulation;Therapeutic exercise;Moist Heat;Neuromuscular education;Splinting;Fluidtherapy;Functional Mobility Training;Patient/family education;DME and/or AE instruction;Manual Therapy;Passive range of motion;Therapeutic activities;Balance training   Plan discuss with pt and husband renewal vs d/c at pt request   OT Home Exercise Plan A/E recommendations issued 10/21/14; issued splint wear and care instructions 10/23/14; issued LUE HEP 10/28/14   Consulted and Agree with Plan of Care Patient;Family member/caregiver   Family Member Consulted spouse        Problem List Patient Active Problem List   Diagnosis Date Noted  . GI bleed 10/08/2014  . Rectal bleeding 10/07/2014  . Acute lower GI bleeding 10/07/2014  . S/P colonoscopic polypectomy 10/07/2014  . Syncope due to orthostatic hypotension 10/07/2014  . Dyspnea 08/27/2013  . Abnormal ECG 08/27/2013  . Stroke   . Hypertension   . Diabetes   . Obesity   . Hyperlipidemia     Quay Burow, OTR/L 11/18/2014, 5:47 PM  [facility] [address] [place], [place], [address] Phone: [phone]   Fax:  [phone]

## 2014-11-18 NOTE — Patient Instructions (Signed)
Provided instruction on assisted reach, incorporation of LUE into several functional tasks as part of HEP

## 2014-11-18 NOTE — Patient Instructions (Signed)
[employer] Foot Up Brace:  1.) Go to the Air traffic controller or [place]). 2.) Type in [email or web address].com 3.) In Google search bar type in: Foot up brace and hit the enter button. 4.) Next scroll through list and click on [email or web address] 5.) Click on Foot Up Brace (brand is [employer]ur) and order brace. The brace has options for shoeless accessory and additional plastic inlays (for increased price).

## 2014-11-19 ENCOUNTER — Ambulatory Visit: Payer: 59

## 2014-11-19 DIAGNOSIS — R269 Unspecified abnormalities of gait and mobility: Secondary | ICD-10-CM | POA: Diagnosis not present

## 2014-11-19 DIAGNOSIS — R531 Weakness: Secondary | ICD-10-CM

## 2014-11-19 NOTE — Therapy (Signed)
[facility] [address] [place], [place], [address] Phone: [phone]   Fax:  [phone]  Physical Therapy Treatment  Patient Details  Name: Erica Cooper MRN: [id number] Date of Birth: 01/07/1951 Referring Provider:  Nolene Ebbs, MD  Encounter Date: 11/18/2014      PT End of Session - 11/19/14 0826    Visit Number 17   Number of Visits 25   Date for PT Re-Evaluation 12/07/14   Authorization Type G-code every 10th visit.   PT Start Time 1442   PT Stop Time 1528   PT Time Calculation (min) 46 min   Equipment Utilized During Treatment Gait belt   Activity Tolerance Patient tolerated treatment well   Behavior During Therapy WFL for tasks assessed/performed      Past Medical History  Diagnosis Date  . Stroke 2007    weakness left side  . Hypertension   . Diabetes     borderline/ no meds  . Obesity   . Hyperlipidemia   . GERD (gastroesophageal reflux disease)   . GI bleeding 09/2014    Past Surgical History  Procedure Laterality Date  . Colonoscopy    . Colonoscopy N/A 10/08/2014    Procedure: COLONOSCOPY;  Surgeon: Gatha Mayer, MD;  Location: [facility];  Service: Endoscopy;  Laterality: N/A;    There were no vitals filed for this visit.  Visit Diagnosis:  Lack of coordination due to stroke  Abnormality of gait  Left-sided weakness      Subjective Assessment - 11/18/14 1445    Subjective Pt denied falls or changes since last session.   Patient is accompained by: Family member   Pertinent History CVA in 2007, Diabetes per medical chart, HTN   Patient Stated Goals Walk better and use L arm to cook and dress herself   Currently in Pain? No/denies                         [facility] Adult PT Treatment/Exercise - 11/18/14 1511    Balance   Balance Assessed Yes   Dynamic Standing Balance   Dynamic Standing - Balance Support No upper extremity supported   Dynamic Standing -  Level of Assistance 4: Min assist;Other (comment)  min guard   Dynamic Standing - Balance Activities Compliant surfaces;Reaching across midline;Reaching for objects;Eyes closed;Eyes open;Head turns   Dynamic Standing - Comments At counter pt performed reaching outside BOS (sup/inf/lat directions) with 7 cones x6, pt placed cones on low and high shelves. VC's to decrease L UE flexion and to improve lateral wt. shifting onto L LE. In //bars: on compliant surface: pt performed head turns, static standing with feet apart, reaching outside BOS and marching in place x10/LE. Pt required min A during marches to maintain balance.                PT Education - 11/19/14 0825    Education provided Yes   Education Details PT went through step by step instructions and demonstrated how to purchase foot up brace online.   Person(s) Educated Patient;Spouse   Methods Explanation;Handout;Verbal cues;Demonstration   Comprehension Verbalized understanding          PT Short Term Goals - 10/23/14 1605    PT SHORT TERM GOAL #1   Title Pt will be independent in HEP to improve strength, balance, and safety during functional mobility. Target date: 10/07/14.   Status Achieved   PT SHORT TERM GOAL #2  Title Pt will improve BERG score to >/=44/56 to decrease falls risk. Target date: 10/07/14.   Status Achieved   PT SHORT TERM GOAL #3   Title Pt will ambulate 300' over even terrain with LRAD at MOD I level to improve functional mobilty. Target date: 10/07/14.   Status Partially Met   PT SHORT TERM GOAL #4   Title Pt will perform TUG with LRAD in </=13.5 seconds to decrease falls risk. Target date: 10/07/14.   Status Not Met           PT Long Term Goals - 11/11/14 1255    PT LONG TERM GOAL #1   Title Pt will verbalize understanding of CVA symptoms and risk factors to reduce risk of future CVA. Target date: 12/07/14.   Status On-going   PT LONG TERM GOAL #2   Title Pt will ambulate 600' over even/uneven  terrain with LRAD at MOD I level to improve functional mobility and to play with her grandchildren. Target dat: 12/07/14.   Status On-going   PT LONG TERM GOAL #3   Title Pt will improve BERG score to >/=48/56 to decrease falls risk. Target date: 11/04/14.   Status Achieved   PT LONG TERM GOAL #4   Title Pt will improve her gait speed with LRAD to >/=1.38f/sec. to reduce falls risk. Target date: 12/07/14.   Status On-going   PT LONG TERM GOAL #5   Title Pt will improve FOTO score by 10 points to improve quality of life. Target date: 12/07/14.   Status On-going   PT LONG TERM GOAL #6   Title Pt will be able to peform reaching outside BOS activities (5 inches) while standing with feet apart for 5 minutes,without LOB, at MOD I level in order to cook at home. Target date: 12/07/14.   Status On-going               Plan - 11/19/14 02774   Clinical Impression Statement Pt demonstrated progress as she was able to perform dynamic balance activities with B UEs/LEs. Pt continues to experience L UE synergy flexion pattern during new and difficult tasks, however, pt is able to decrease L UE flexion with cues. continue with POC.   Pt will benefit from skilled therapeutic intervention in order to improve on the following deficits Abnormal gait;Decreased endurance;Decreased knowledge of use of DME;Decreased balance;Decreased mobility;Decreased strength;Impaired UE functional use;Decreased range of motion;Other (comment)   Rehab Potential Good   Clinical Impairments Affecting Rehab Potential CVA was 8.5 years ago   PT Frequency 2x / week   PT Duration 8 weeks   PT Treatment/Interventions ADLs/Self Care Home Management;Gait training;Neuromuscular re-education;Stair training;Biofeedback;Functional mobility training;Patient/family education;Therapeutic activities;Electrical Stimulation;Therapeutic exercise;Manual techniques;DME Instruction;Balance training;Other (comment)   PT Next Visit Plan Dynamic  gait/balance training.   Consulted and Agree with Plan of Care Patient;Family member/caregiver   Family Member Consulted pt's husband-[name]?        Problem List Patient Active Problem List   Diagnosis Date Noted  . GI bleed 10/08/2014  . Rectal bleeding 10/07/2014  . Acute lower GI bleeding 10/07/2014  . S/P colonoscopic polypectomy 10/07/2014  . Syncope due to orthostatic hypotension 10/07/2014  . Dyspnea 08/27/2013  . Abnormal ECG 08/27/2013  . Stroke   . Hypertension   . Diabetes   . Obesity   . Hyperlipidemia     Theta Leaf L 11/19/2014, 8:28 AM  [id number] [place] [address]Phone: [phone]  Fax:  [phone]     Geoffry Paradise, PT,DPT 11/19/2014 8:28 AM Phone: [phone] Fax: [phone]

## 2014-11-19 NOTE — Therapy (Signed)
[facility] [address] [place], [place], [address] Phone: [phone]   Fax:  [phone]  Physical Therapy Treatment  Patient Details  Name: Erica Cooper MRN: [id number] Date of Birth: [birth year] Referring Provider:  Nolene Ebbs, MD  Encounter Date: 11/19/2014      PT End of Session - 11/19/14 0912    Visit Number 18   Number of Visits 25   Date for PT Re-Evaluation 12/07/14   Authorization Type G-code every 10th visit.   PT Start Time 0848   PT Stop Time 0927   PT Time Calculation (min) 39 min   Equipment Utilized During Treatment Gait belt   Activity Tolerance Patient tolerated treatment well   Behavior During Therapy WFL for tasks assessed/performed      Past Medical History  Diagnosis Date  . Stroke 2007    weakness left side  . Hypertension   . Diabetes     borderline/ no meds  . Obesity   . Hyperlipidemia   . GERD (gastroesophageal reflux disease)   . GI bleeding 09/2014    Past Surgical History  Procedure Laterality Date  . Colonoscopy    . Colonoscopy N/A 10/08/2014    Procedure: COLONOSCOPY;  Surgeon: Gatha Mayer, MD;  Location: [facility];  Service: Endoscopy;  Laterality: N/A;    There were no vitals filed for this visit.  Visit Diagnosis:  Abnormality of gait  Left-sided weakness      Subjective Assessment - 11/19/14 0858    Subjective Pt denied falls or changes since last visit.   Patient is accompained by: Family member   Pertinent History CVA in 2007, Diabetes per medical chart, HTN   Patient Stated Goals Walk better and use L arm to cook and dress herself   Currently in Pain? No/denies                         [facility] Adult PT Treatment/Exercise - 11/19/14 0909    Ambulation/Gait   Ambulation/Gait Yes   Ambulation/Gait Assistance 5: Supervision;4: Min guard   Ambulation/Gait Assistance Details Pt ambulated indoors and outdoors with demonstration and  tactile cues to reduce  L UE flexion and improve L knee flexion. Pt ambulated over 2" foam beams to improve L LE knee flexion and to improve stride length.   Ambulation Distance (Feet) --  75x6, 10x4, 117'   Assistive device None   Gait Pattern Step-through pattern;Decreased dorsiflexion - left;Decreased hip/knee flexion - left;Decreased stride length;Left circumduction;Wide base of support;Decreased trunk rotation   Ambulation Surface Level;Unlevel;Indoor;Paved;Grass   Exercises   Exercises Knee/Hip   Knee/Hip Exercises: Aerobic   Stationary Bike Scifit level 1.0 LE's only x 5 minutes to improve LLE knee flexion. Cues to keep RPMs >50 and to keep L knee in neutral vs. adduction.   Knee/Hip Exercises: Seated   Long Arc Quad Strengthening;10 reps;3 sets;Left;Weights  ankle weight: 7 pounds   Long [employer] Weight 7 lbs.   Long Arc Quad Limitations Pt required cues to move LLE in full ROM and to decrease L hip hike to compensate for weakness.                PT Education - 11/19/14 0912    Education provided Yes   Education Details LAQ with ankle wt.   Person(s) Educated Patient   Methods Explanation;Verbal cues;Demonstration   Comprehension Verbalized understanding;Returned demonstration          PT Short  Term Goals - 10/23/14 1605    PT SHORT TERM GOAL #1   Title Pt will be independent in HEP to improve strength, balance, and safety during functional mobility. Target date: 10/07/14.   Status Achieved   PT SHORT TERM GOAL #2   Title Pt will improve BERG score to >/=44/56 to decrease falls risk. Target date: 10/07/14.   Status Achieved   PT SHORT TERM GOAL #3   Title Pt will ambulate 300' over even terrain with LRAD at MOD I level to improve functional mobilty. Target date: 10/07/14.   Status Partially Met   PT SHORT TERM GOAL #4   Title Pt will perform TUG with LRAD in </=13.5 seconds to decrease falls risk. Target date: 10/07/14.   Status Not Met           PT Long  Term Goals - 11/11/14 1255    PT LONG TERM GOAL #1   Title Pt will verbalize understanding of CVA symptoms and risk factors to reduce risk of future CVA. Target date: 12/07/14.   Status On-going   PT LONG TERM GOAL #2   Title Pt will ambulate 600' over even/uneven terrain with LRAD at MOD I level to improve functional mobility and to play with her grandchildren. Target dat: 12/07/14.   Status On-going   PT LONG TERM GOAL #3   Title Pt will improve BERG score to >/=48/56 to decrease falls risk. Target date: 11/04/14.   Status Achieved   PT LONG TERM GOAL #4   Title Pt will improve her gait speed with LRAD to >/=1.58ft/sec. to reduce falls risk. Target date: 12/07/14.   Status On-going   PT LONG TERM GOAL #5   Title Pt will improve FOTO score by 10 points to improve quality of life. Target date: 12/07/14.   Status On-going   PT LONG TERM GOAL #6   Title Pt will be able to peform reaching outside BOS activities (5 inches) while standing with feet apart for 5 minutes,without LOB, at MOD I level in order to cook at home. Target date: 12/07/14.   Status On-going               Plan - 11/19/14 1355    Clinical Impression Statement Pt continues to experience increased L UE synergy during gait activites, but did experience decreased flexion with VC's and manual cues to activate L triceps. Pt demonstrated improve L knee flexion with manual faciliation to L hamstrings. Continue with POC.   Pt will benefit from skilled therapeutic intervention in order to improve on the following deficits Abnormal gait;Decreased endurance;Decreased knowledge of use of DME;Decreased balance;Decreased mobility;Decreased strength;Impaired UE functional use;Decreased range of motion;Other (comment)   Rehab Potential Good   Clinical Impairments Affecting Rehab Potential CVA was 8.5 years ago   PT Frequency 2x / week   PT Duration 8 weeks   PT Treatment/Interventions ADLs/Self Care Home Management;Gait  training;Neuromuscular re-education;Stair training;Biofeedback;Functional mobility training;Patient/family education;Therapeutic activities;Electrical Stimulation;Therapeutic exercise;Manual techniques;DME Instruction;Balance training;Other (comment)   PT Next Visit Plan Dynamic gait/balance training.   Consulted and Agree with Plan of Care Patient        Problem List Patient Active Problem List   Diagnosis Date Noted  . GI bleed 10/08/2014  . Rectal bleeding 10/07/2014  . Acute lower GI bleeding 10/07/2014  . S/P colonoscopic polypectomy 10/07/2014  . Syncope due to orthostatic hypotension 10/07/2014  . Dyspnea 08/27/2013  . Abnormal ECG 08/27/2013  . Stroke   . Hypertension   . Diabetes   .  Obesity   . Hyperlipidemia     Croy Drumwright L 11/19/2014, 1:57 PM  [facility] [address] [place], [place], [address] Phone: [phone]   Fax:  [phone]     Geoffry Paradise, PT,DPT 11/19/2014 1:57 PM Phone: [phone] Fax: [phone]

## 2014-11-25 ENCOUNTER — Encounter: Payer: 59 | Admitting: Occupational Therapy

## 2014-11-25 ENCOUNTER — Ambulatory Visit: Payer: 59

## 2014-11-25 ENCOUNTER — Ambulatory Visit: Payer: 59 | Admitting: Occupational Therapy

## 2014-11-25 DIAGNOSIS — R269 Unspecified abnormalities of gait and mobility: Secondary | ICD-10-CM | POA: Diagnosis not present

## 2014-11-25 DIAGNOSIS — R531 Weakness: Secondary | ICD-10-CM

## 2014-11-25 NOTE — Therapy (Signed)
[facility] [address] [place], [place], [address] Phone: [phone]   Fax:  [phone]  Physical Therapy Treatment  Patient Details  Name: Erica Cooper MRN: [id number] Date of Birth: [birth year] Referring Provider:  Nolene Ebbs, MD  Encounter Date: 11/25/2014      PT End of Session - 11/25/14 1334    Visit Number 19   Number of Visits 25   Date for PT Re-Evaluation 12/07/14   Authorization Type G-code every 10th visit.   PT Start Time 1020   PT Stop Time 1059   PT Time Calculation (min) 39 min   Equipment Utilized During Treatment Gait belt   Activity Tolerance Patient tolerated treatment well   Behavior During Therapy WFL for tasks assessed/performed      Past Medical History  Diagnosis Date  . Stroke 2007    weakness left side  . Hypertension   . Diabetes     borderline/ no meds  . Obesity   . Hyperlipidemia   . GERD (gastroesophageal reflux disease)   . GI bleeding 09/2014    Past Surgical History  Procedure Laterality Date  . Colonoscopy    . Colonoscopy N/A 10/08/2014    Procedure: COLONOSCOPY;  Surgeon: Gatha Mayer, MD;  Location: [facility];  Service: Endoscopy;  Laterality: N/A;    There were no vitals filed for this visit.  Visit Diagnosis:  Abnormality of gait  Left-sided weakness      Subjective Assessment - 11/25/14 1022    Subjective Pt denied falls. Pt reported she's on allergy medication due to sneezing/allergies.   Patient is accompained by: Family member  pt's son   Pertinent History CVA in 2007, Diabetes per medical chart, HTN   Patient Stated Goals Walk better and use L arm to cook and dress herself   Currently in Pain? No/denies                         [facility] Adult PT Treatment/Exercise - 11/25/14 1024    Ambulation/Gait   Ambulation/Gait Yes   Ambulation/Gait Assistance 5: Supervision   Ambulation/Gait Assistance Details VC's to improve L  knee flexion and upright posture. tactile cues and facilitation at L scapula and trunk to decr. L elbow flex.   Ambulation Distance (Feet) --  75'x2, 64' with SPC and without SPC, 50'x6 with SPC   Assistive device Straight cane;None   Gait Pattern Step-through pattern;Decreased dorsiflexion - left;Decreased hip/knee flexion - left;Decreased stride length;Left circumduction;Wide base of support;Decreased trunk rotation   Ambulation Surface Level;Indoor   Gait velocity 1.67  with SPC and 1.48f/sec. without AD   Curb 5: Supervision   Curb Details (indicate cue type and reason) x1. Cues for sequencing with SPC.   High Level Balance   High Level Balance Activities Head turns;Other (comment)  amb. with vertical/horizontal head turns.   High Level Balance Comments 4x7'/activity with 0-1 UE support at counter as needed. Performed with min guard to supervision. Pt performed forward amb. with eyes closed and vertical/horizontal head turns. Cues for technique and to decr. L UE flexion. ant/post stepping/weight shifting performed on ramp (ascending/descending) with min guard and cues to improve upright posture on decline.                PT Education - 11/25/14 1333    Education provided Yes   Education Details Discussed using SPC and foot up brace to improve balance and L toe clearance.  Person(s) Educated Patient;Child(ren)   Methods Explanation   Comprehension Verbalized understanding          PT Short Term Goals - 10/23/14 1605    PT SHORT TERM GOAL #1   Title Pt will be independent in HEP to improve strength, balance, and safety during functional mobility. Target date: 10/07/14.   Status Achieved   PT SHORT TERM GOAL #2   Title Pt will improve BERG score to >/=44/56 to decrease falls risk. Target date: 10/07/14.   Status Achieved   PT SHORT TERM GOAL #3   Title Pt will ambulate 300' over even terrain with LRAD at MOD I level to improve functional mobilty. Target date: 10/07/14.    Status Partially Met   PT SHORT TERM GOAL #4   Title Pt will perform TUG with LRAD in </=13.5 seconds to decrease falls risk. Target date: 10/07/14.   Status Not Met           PT Long Term Goals - 11/11/14 1255    PT LONG TERM GOAL #1   Title Pt will verbalize understanding of CVA symptoms and risk factors to reduce risk of future CVA. Target date: 12/07/14.   Status On-going   PT LONG TERM GOAL #2   Title Pt will ambulate 600' over even/uneven terrain with LRAD at MOD I level to improve functional mobility and to play with her grandchildren. Target dat: 12/07/14.   Status On-going   PT LONG TERM GOAL #3   Title Pt will improve BERG score to >/=48/56 to decrease falls risk. Target date: 11/04/14.   Status Achieved   PT LONG TERM GOAL #4   Title Pt will improve her gait speed with LRAD to >/=1.62ft/sec. to reduce falls risk. Target date: 12/07/14.   Status On-going   PT LONG TERM GOAL #5   Title Pt will improve FOTO score by 10 points to improve quality of life. Target date: 12/07/14.   Status On-going   PT LONG TERM GOAL #6   Title Pt will be able to peform reaching outside BOS activities (5 inches) while standing with feet apart for 5 minutes,without LOB, at MOD I level in order to cook at home. Target date: 12/07/14.   Status On-going               Plan - 11/25/14 1334    Clinical Impression Statement Pt noted to experience improve balance during amb. with SPC and decreased L UE synergy. Pt's gait speed was 1.14ft/sec with SPC but her balance was improved, even though pt's gait speed without AD was 1.8ft/sec which indicated decr. risk for falls. Continue with POC.   Pt will benefit from skilled therapeutic intervention in order to improve on the following deficits Abnormal gait;Decreased endurance;Decreased knowledge of use of DME;Decreased balance;Decreased mobility;Decreased strength;Impaired UE functional use;Decreased range of motion;Other (comment)   Rehab Potential Good    PT Frequency 2x / week   PT Duration 8 weeks   PT Treatment/Interventions ADLs/Self Care Home Management;Gait training;Neuromuscular re-education;Stair training;Biofeedback;Functional mobility training;Patient/family education;Therapeutic activities;Electrical Stimulation;Therapeutic exercise;Manual techniques;DME Instruction;Balance training;Other (comment)   PT Next Visit Plan Dynamic gait/balance training.   Consulted and Agree with Plan of Care Patient   Family Member Consulted pt's son        Problem List Patient Active Problem List   Diagnosis Date Noted  . GI bleed 10/08/2014  . Rectal bleeding 10/07/2014  . Acute lower GI bleeding 10/07/2014  . S/P colonoscopic polypectomy 10/07/2014  . Syncope due to  orthostatic hypotension 10/07/2014  . Dyspnea 08/27/2013  . Abnormal ECG 08/27/2013  . Stroke   . Hypertension   . Diabetes   . Obesity   . Hyperlipidemia     Claron Rosencrans L 11/25/2014, 1:37 PM  [facility] [address] [place], [place], [address] Phone: [phone]   Fax:  [phone]     Geoffry Paradise, PT,DPT 11/25/2014 1:37 PM Phone: [phone] Fax: [phone]

## 2014-11-25 NOTE — Therapy (Signed)
[facility] [address] [place], [place], [address] Phone: [phone]   Fax:  [phone]  Patient Details  Name: Neveyah Garzon MRN: [id number] Date of Birth: 1951/03/28 Referring Provider:  No ref. provider found  Encounter Date: 11/25/2014    OCCUPATIONAL THERAPY DISCHARGE SUMMARY  Visits from Start of Care: 9  Current functional level related to goals / functional outcomes:     OT Long Term Goals - 11/18/14 1745    OT LONG TERM GOAL #1   Title Independent w/ HEP for LUE (DUE 10/31/14)   Time 4   Period Weeks   Status Achieved  11/04/14:  min cues for positioning   OT LONG TERM GOAL #2   Title Verbalize understanding with potential A/E needs for greater ease/safety with cooking/IADLS and how to acquire A/E   Time 4   Period Weeks   Status Achieved  issued 10/21/14   OT LONG TERM GOAL #3   Title Independent with splint wear and care for Lt thumb   Time 4   Period Weeks   Status Achieved   OT LONG TERM GOAL #4   Title Improve LUE function as evidenced by performing 10 blocks or more on Box & Blocks test   Baseline 5   Time 4   Period Weeks   Status Achieved  In standing, 11 blocks        Remaining deficits: LUE ROM  LUE spasticity   Education / Equipment: Splint wear and care, HEP, A/E recommendations  Plan: Patient agrees to discharge.  Patient goals were met. Patient is being discharged due to meeting the stated rehab goals.  ?????        Carey Bullocks, OTR/L 11/25/2014, 9:20 AM  [facility] [address] [place], [place], [address] Phone: [phone]   Fax:  [phone]

## 2014-11-28 ENCOUNTER — Ambulatory Visit: Payer: 59 | Attending: Internal Medicine

## 2014-11-28 DIAGNOSIS — R269 Unspecified abnormalities of gait and mobility: Secondary | ICD-10-CM

## 2014-11-28 DIAGNOSIS — M6289 Other specified disorders of muscle: Secondary | ICD-10-CM | POA: Diagnosis present

## 2014-11-28 DIAGNOSIS — R531 Weakness: Secondary | ICD-10-CM

## 2014-11-28 NOTE — Therapy (Signed)
[facility] [address] [place], [place], [address] Phone: [phone]   Fax:  [phone]  Physical Therapy Treatment  Patient Details  Name: Erica Cooper MRN: [id number] Date of Birth: 1950-07-11 Referring Provider:  Nolene Ebbs, MD  Encounter Date: 11/28/2014      PT End of Session - 11/28/14 1142    Visit Number 20   Number of Visits 25   Date for PT Re-Evaluation 12/07/14   Authorization Type Pt's son reports pt does not have Medicare, just UHC-no G-code assessed.   PT Start Time 1016   PT Stop Time 1058   PT Time Calculation (min) 42 min   Equipment Utilized During Treatment Gait belt   Activity Tolerance Patient tolerated treatment well   Behavior During Therapy WFL for tasks assessed/performed      Past Medical History  Diagnosis Date  . Stroke 2007    weakness left side  . Hypertension   . Diabetes     borderline/ no meds  . Obesity   . Hyperlipidemia   . GERD (gastroesophageal reflux disease)   . GI bleeding 09/2014    Past Surgical History  Procedure Laterality Date  . Colonoscopy    . Colonoscopy N/A 10/08/2014    Procedure: COLONOSCOPY;  Surgeon: Gatha Mayer, MD;  Location: [facility];  Service: Endoscopy;  Laterality: N/A;    There were no vitals filed for this visit.  Visit Diagnosis:  Abnormality of gait  Left-sided weakness      Subjective Assessment - 11/28/14 1020    Subjective Pt denied any changes or falls since last visit.    Patient is accompained by: Family member  pt's husband   Pertinent History CVA in 2007, Diabetes per medical chart, HTN   Patient Stated Goals Walk better and use L arm to cook and dress herself   Currently in Pain? No/denies                         [facility] Adult PT Treatment/Exercise - 11/28/14 1021    Ambulation/Gait   Ambulation/Gait Yes   Ambulation/Gait Assistance 5: Supervision   Ambulation/Gait Assistance Details  VC's for sequencing with SPC and to decr. L UE flexion.  Pt required cues to improve sequencing with SPC when performing head turns.Pt required one seated rest break after amb.   Ambulation Distance (Feet) --  600', 20'x4 (figure eights around stools with and withoutSPC   Assistive device Straight cane;None   Gait Pattern Step-through pattern;Decreased dorsiflexion - left;Decreased hip/knee flexion - left;Decreased stride length;Left circumduction;Wide base of support;Decreased trunk rotation   Ambulation Surface Level;Unlevel;Indoor;Outdoor;Paved   Gait velocity --   Curb --   Balance   Balance Assessed Yes   Dynamic Standing Balance   Dynamic Standing - Balance Support No upper extremity supported   Dynamic Standing - Level of Assistance 4: Min assist;Other (comment)  min guard   Dynamic Standing - Balance Activities Eyes open;Other (comment);Lateral lean/weight shifting;Forward lean/weight shifting;Alternating  foot traps   Dynamic Standing - Comments B LE heel/toe dot taps x30, 2" alternating toe taps 2x10 (one set performed in 11 seconds), lateral 4" step ups and toe taps x10/LE, 2" step downs with toe taps 2x10/LE all to improve ant/lateral weight shifting. VC's to improve lateral weight shifting and for technique.                PT Education - 11/28/14 1142    Education provided Yes  Education Details PT educated pt that using SPC in R hand improve stability for L LE, as pt asked to use SPC in L hand to improve L grip.   Person(s) Educated Patient   Methods Explanation   Comprehension Verbalized understanding          PT Short Term Goals - 10/23/14 1605    PT SHORT TERM GOAL #1   Title Pt will be independent in HEP to improve strength, balance, and safety during functional mobility. Target date: 10/07/14.   Status Achieved   PT SHORT TERM GOAL #2   Title Pt will improve BERG score to >/=44/56 to decrease falls risk. Target date: 10/07/14.   Status Achieved   PT  SHORT TERM GOAL #3   Title Pt will ambulate 300' over even terrain with LRAD at MOD I level to improve functional mobilty. Target date: 10/07/14.   Status Partially Met   PT SHORT TERM GOAL #4   Title Pt will perform TUG with LRAD in </=13.5 seconds to decrease falls risk. Target date: 10/07/14.   Status Not Met           PT Long Term Goals - 11/11/14 1255    PT LONG TERM GOAL #1   Title Pt will verbalize understanding of CVA symptoms and risk factors to reduce risk of future CVA. Target date: 12/07/14.   Status On-going   PT LONG TERM GOAL #2   Title Pt will ambulate 600' over even/uneven terrain with LRAD at MOD I level to improve functional mobility and to play with her grandchildren. Target dat: 12/07/14.   Status On-going   PT LONG TERM GOAL #3   Title Pt will improve BERG score to >/=48/56 to decrease falls risk. Target date: 11/04/14.   Status Achieved   PT LONG TERM GOAL #4   Title Pt will improve her gait speed with LRAD to >/=1.74f/sec. to reduce falls risk. Target date: 12/07/14.   Status On-going   PT LONG TERM GOAL #5   Title Pt will improve FOTO score by 10 points to improve quality of life. Target date: 12/07/14.   Status On-going   PT LONG TERM GOAL #6   Title Pt will be able to peform reaching outside BOS activities (5 inches) while standing with feet apart for 5 minutes,without LOB, at MOD I level in order to cook at home. Target date: 12/07/14.   Status On-going               Plan - 11/28/14 1143    Clinical Impression Statement Pt demonstrated improve stride length, decrease L UE flexion, and improve balance during ambulation with SPC. Pt continues to experience diffculty with lateral/ant weight shifting, as she required min guard to min A to maintain balance during dynamic balance activities. Continue with POC.   Pt will benefit from skilled therapeutic intervention in order to improve on the following deficits Abnormal gait;Decreased endurance;Decreased  knowledge of use of DME;Decreased balance;Decreased mobility;Decreased strength;Impaired UE functional use;Decreased range of motion;Other (comment)   Rehab Potential Good   Clinical Impairments Affecting Rehab Potential CVA was 8.5 years ago   PT Frequency 2x / week   PT Duration 8 weeks   PT Treatment/Interventions ADLs/Self Care Home Management;Gait training;Neuromuscular re-education;Stair training;Biofeedback;Functional mobility training;Patient/family education;Therapeutic activities;Electrical Stimulation;Therapeutic exercise;Manual techniques;DME Instruction;Balance training;Other (comment)   PT Next Visit Plan Begin to assess LTGs.   Consulted and Agree with Plan of Care Patient        Problem List Patient Active  Problem List   Diagnosis Date Noted  . GI bleed 10/08/2014  . Rectal bleeding 10/07/2014  . Acute lower GI bleeding 10/07/2014  . S/P colonoscopic polypectomy 10/07/2014  . Syncope due to orthostatic hypotension 10/07/2014  . Dyspnea 08/27/2013  . Abnormal ECG 08/27/2013  . Stroke   . Hypertension   . Diabetes   . Obesity   . Hyperlipidemia     Martena Emanuele L 11/28/2014, 11:45 AM  [facility] [address] [place], [place], [address] Phone: [phone]   Fax:  [phone]     Geoffry Paradise, PT,DPT 11/28/2014 11:45 AM Phone: [phone] Fax: [phone]

## 2014-12-03 ENCOUNTER — Ambulatory Visit: Payer: 59 | Admitting: Physical Therapy

## 2014-12-03 DIAGNOSIS — R269 Unspecified abnormalities of gait and mobility: Secondary | ICD-10-CM | POA: Diagnosis not present

## 2014-12-03 DIAGNOSIS — R531 Weakness: Secondary | ICD-10-CM

## 2014-12-03 NOTE — Therapy (Signed)
[facility] [address] [place], [place], [address] Phone: [phone]   Fax:  [phone]  Physical Therapy Treatment  Patient Details  Name: Erica Cooper MRN: [id number] Date of Birth: [birth year] Referring Provider:  Nolene Ebbs, MD  Encounter Date: 12/03/2014      PT End of Session - 12/03/14 0943    Visit Number 21   Number of Visits 25   Date for PT Re-Evaluation 12/07/14   Authorization Type Pt's son reports pt does not have Medicare, just UHC-no G-code assessed.   PT Start Time [phone]   PT Stop Time 0932   PT Time Calculation (min) 45 min   Activity Tolerance Patient tolerated treatment well   Behavior During Therapy [facility] for tasks assessed/performed      Past Medical History  Diagnosis Date  . Stroke 2007    weakness left side  . Hypertension   . Diabetes     borderline/ no meds  . Obesity   . Hyperlipidemia   . GERD (gastroesophageal reflux disease)   . GI bleeding 09/2014    Past Surgical History  Procedure Laterality Date  . Colonoscopy    . Colonoscopy N/A 10/08/2014    Procedure: COLONOSCOPY;  Surgeon: Gatha Mayer, MD;  Location: [facility];  Service: Endoscopy;  Laterality: N/A;    There were no vitals filed for this visit.  Visit Diagnosis:  Abnormality of gait  Left-sided weakness      Subjective Assessment - 12/03/14 0858    Subjective Walked around on the 4th outside with cane.  Denies falls or changes since last visit.   Patient is accompained by: Family member  husband   Pertinent History CVA in 2007, Diabetes per medical chart, HTN   Patient Stated Goals Walk better and use L arm to cook and dress herself   Currently in Pain? No/denies            [facility] Adult PT Treatment/Exercise - 12/03/14 0859    Transfers   Transfers Sit to Stand;Stand to Sit   Sit to Stand 7: Independent   Stand to Sit 7: Independent   Ambulation/Gait   Ambulation/Gait Yes   Ambulation/Gait Assistance 6: Modified independent (Device/Increase time)   Ambulation/Gait Assistance Details several episodes of L foot catching but no LOB   Ambulation Distance (Feet) 600 Feet  plus   Assistive device Straight cane;None   Gait Pattern Step-through pattern;Decreased hip/knee flexion - left;Decreased dorsiflexion - left;Left circumduction;Trunk flexed   Ambulation Surface Level;Indoor   Gait velocity 1.63 ft/sec  no device and 1.65ft/sec with SPC-pt more aware of pattern   Knee/Hip Exercises: Aerobic   Stationary Bike Scifit level 1.5 LE's only x 8 minutes to improve flexibility in LLE   Knee/Hip Exercises: Standing   Functional Squat 10 reps;Other (comment)  working on equal weight bearing with RLE on bubble   Functional Squat Limitations cues to perform equal weight bearing in LE's.  Discussed doing this as part of therapy at home and to be aware of positioning;  10 times sit<>stand as well working on same   Other Standing Knee Exercises taps to 6" step with RLE and LLE with 1 UE support for stengthening.  Tends to circumduct when placing LLE onto step.   Knee/Hip Exercises: Seated   Hamstring Curl Left;AAROM;10 reps   Hamstring Limitations cues to complete available ROM;used pillow case on floor to improve ROM  PT Education - 12/03/14 [phone]    Education provided Yes   Education Details Equal weightbearing with sit<>stand, progress toward goals   Person(s) Educated Patient;Spouse   Methods Explanation;Demonstration   Comprehension Verbalized understanding          PT Short Term Goals - 10/23/14 1605    PT SHORT TERM GOAL #1   Title Pt will be independent in HEP to improve strength, balance, and safety during functional mobility. Target date: 10/07/14.   Status Achieved   PT SHORT TERM GOAL #2   Title Pt will improve BERG score to >/=44/56 to decrease falls risk. Target date: 10/07/14.   Status Achieved   PT SHORT TERM GOAL #3   Title Pt  will ambulate 300' over even terrain with LRAD at MOD I level to improve functional mobilty. Target date: 10/07/14.   Status Partially Met   PT SHORT TERM GOAL #4   Title Pt will perform TUG with LRAD in </=13.5 seconds to decrease falls risk. Target date: 10/07/14.   Status Not Met           PT Long Term Goals - 12/03/14 0946    PT LONG TERM GOAL #1   Title Pt will verbalize understanding of CVA symptoms and risk factors to reduce risk of future CVA. Target date: 12/07/14.   Status On-going   PT LONG TERM GOAL #2   Title Pt will ambulate 600' over even/uneven terrain with LRAD at MOD I level to improve functional mobility and to play with her grandchildren. Target dat: 12/07/14.   Status Achieved   PT LONG TERM GOAL #3   Title Pt will improve BERG score to >/=48/56 to decrease falls risk. Target date: 11/04/14.   Status Achieved   PT LONG TERM GOAL #4   Title Pt will improve her gait speed with LRAD to >/=1.4ft/sec. to reduce falls risk. Target date: 12/07/14.   Status Not Met   PT LONG TERM GOAL #5   Title Pt will improve FOTO score by 10 points to improve quality of life. Target date: 12/07/14.   Status On-going   PT LONG TERM GOAL #6   Title Pt will be able to peform reaching outside BOS activities (5 inches) while standing with feet apart for 5 minutes,without LOB, at MOD I level in order to cook at home. Target date: 12/07/14.   Status On-going               Plan - 12/03/14 0944    Clinical Impression Statement Pt met LTG# 2.  Goal #4 not met as pt's gait speed has not increased although pt is more aware of improper patterns with gait.  Continue PT per POC.   Pt will benefit from skilled therapeutic intervention in order to improve on the following deficits Abnormal gait;Decreased endurance;Decreased knowledge of use of DME;Decreased balance;Decreased mobility;Decreased strength;Impaired UE functional use;Decreased range of motion;Other (comment)   Clinical Impairments  Affecting Rehab Potential CVA was 8.5 years ago   PT Frequency 2x / week   PT Duration 8 weeks   PT Treatment/Interventions ADLs/Self Care Home Management;Gait training;Neuromuscular re-education;Stair training;Biofeedback;Functional mobility training;Patient/family education;Therapeutic activities;Electrical Stimulation;Therapeutic exercise;Manual techniques;DME Instruction;Balance training;Other (comment)   PT Next Visit Plan Finish checking LTG's.  FOTO.  Discharge per [place].   Consulted and Agree with Plan of Care Patient;Family member/caregiver   Family Member Consulted husband        Problem List Patient Active Problem List   Diagnosis Date Noted  . GI  bleed 10/08/2014  . Rectal bleeding 10/07/2014  . Acute lower GI bleeding 10/07/2014  . S/P colonoscopic polypectomy 10/07/2014  . Syncope due to orthostatic hypotension 10/07/2014  . Dyspnea 08/27/2013  . Abnormal ECG 08/27/2013  . Stroke   . Hypertension   . Diabetes   . Obesity   . Hyperlipidemia     Narda Bonds 12/03/2014, 9:47 AM  [facility] [address] [place], [place], [address] Phone: [phone]   Fax:  [place] 12/03/2014 9:47 AM Phone: [phone] Fax: [phone]

## 2014-12-05 ENCOUNTER — Ambulatory Visit: Payer: 59

## 2014-12-05 DIAGNOSIS — R269 Unspecified abnormalities of gait and mobility: Secondary | ICD-10-CM | POA: Diagnosis not present

## 2014-12-05 NOTE — Therapy (Signed)
[facility] [address] [place], [place], [address] Phone: [phone]   Fax:  [phone]  Physical Therapy Treatment  Patient Details  Name: Erica Cooper MRN: [id number] Date of Birth: 04/27/1951 Referring Provider:  Nolene Ebbs, MD  Encounter Date: 12/05/2014      PT End of Session - 12/05/14 1048    Visit Number 22   Number of Visits 25   Date for PT Re-Evaluation 12/07/14   Authorization Type Pt's son reports pt does not have Medicare, just UHC-no G-code assessed.   PT Start Time 1019   PT Stop Time 1042   PT Time Calculation (min) 23 min   Activity Tolerance Patient tolerated treatment well   Behavior During Therapy WFL for tasks assessed/performed      Past Medical History  Diagnosis Date  . Stroke 2007    weakness left side  . Hypertension   . Diabetes     borderline/ no meds  . Obesity   . Hyperlipidemia   . GERD (gastroesophageal reflux disease)   . GI bleeding 09/2014    Past Surgical History  Procedure Laterality Date  . Colonoscopy    . Colonoscopy N/A 10/08/2014    Procedure: COLONOSCOPY;  Surgeon: Gatha Mayer, MD;  Location: [facility];  Service: Endoscopy;  Laterality: N/A;    There were no vitals filed for this visit.  Visit Diagnosis:  Abnormality of gait      Subjective Assessment - 12/05/14 1022    Subjective Pt denied falls or changes since last visit.   Patient is accompained by: Family member   Pertinent History CVA in 2007, Diabetes per medical chart, HTN   Patient Stated Goals Walk better and use L arm to cook and dress herself   Currently in Pain? No/denies                         [facility] Adult PT Treatment/Exercise - 12/05/14 1046    Dynamic Standing Balance   Dynamic Standing - Balance Support No upper extremity supported   Dynamic Standing - Level of Assistance 7: Independent   Dynamic Standing - Balance Activities Other (comment)   Dynamic  Standing - Comments Pt with feet apart for 5 minutes, performed reaching 5-10 inches outside BOS to mimic cooking activities, performed without LOB. Reviewed heel/toe taps to improve lateral weight shifting.     FOTO: 42           PT Education - 12/05/14 1047    Education provided Yes   Education Details PT reviewed CVA symptoms and risk factors with pt and she required cues from husband. PT also reiterated the importance of performing HEP and walking (with SPC) at home to maintain gains made in PT. PT also educated pt on performing toe/heel taps at home (per pt request) at stairs WITH Handrail and family member present for safety. PT also educated pt on physioball therex (hip flexion/shoulder flex) to improve core strength and balance, PT educated pt on performing with family member for safety.   Person(s) Educated Patient;Spouse   Methods Explanation   Comprehension Verbalized understanding          PT Short Term Goals - 10/23/14 1605    PT SHORT TERM GOAL #1   Title Pt will be independent in HEP to improve strength, balance, and safety during functional mobility. Target date: 10/07/14.   Status Achieved   PT SHORT TERM GOAL #2   Title  Pt will improve BERG score to >/=44/56 to decrease falls risk. Target date: 10/07/14.   Status Achieved   PT SHORT TERM GOAL #3   Title Pt will ambulate 300' over even terrain with LRAD at MOD I level to improve functional mobilty. Target date: 10/07/14.   Status Partially Met   PT SHORT TERM GOAL #4   Title Pt will perform TUG with LRAD in </=13.5 seconds to decrease falls risk. Target date: 10/07/14.   Status Not Met           PT Long Term Goals - 12/05/14 1049    PT LONG TERM GOAL #1   Title Pt will verbalize understanding of CVA symptoms and risk factors to reduce risk of future CVA. Target date: 12/07/14.   Status Partially Met   PT LONG TERM GOAL #2   Title Pt will ambulate 600' over even/uneven terrain with LRAD at MOD I level to  improve functional mobility and to play with her grandchildren. Target dat: 12/07/14.   Status Achieved   PT LONG TERM GOAL #3   Title Pt will improve BERG score to >/=48/56 to decrease falls risk. Target date: 11/04/14.   Status Achieved   PT LONG TERM GOAL #4   Title Pt will improve her gait speed with LRAD to >/=1.8ft/sec. to reduce falls risk. Target date: 12/07/14.   Status Not Met   PT LONG TERM GOAL #5   Title Pt will improve FOTO score by 10 points to improve quality of life. Target date: 12/07/14.   Status Not Met   PT LONG TERM GOAL #6   Title Pt will be able to peform reaching outside BOS activities (5 inches) while standing with feet apart for 5 minutes,without LOB, at MOD I level in order to cook at home. Target date: 12/07/14.   Status Achieved               Plan - 12/05/14 1049    Clinical Impression Statement Pt partially met LTG 1, met LTG 6 and did not meet FOTO goal. Pt discharging today, please see d/c summary for details.        Problem List Patient Active Problem List   Diagnosis Date Noted  . GI bleed 10/08/2014  . Rectal bleeding 10/07/2014  . Acute lower GI bleeding 10/07/2014  . S/P colonoscopic polypectomy 10/07/2014  . Syncope due to orthostatic hypotension 10/07/2014  . Dyspnea 08/27/2013  . Abnormal ECG 08/27/2013  . Stroke   . Hypertension   . Diabetes   . Obesity   . Hyperlipidemia     Miller,Jennifer L 12/05/2014, 11:00 AM  [facility] [address] [place], [place], [address] Phone: [phone]   Fax:  [phone]  PHYSICAL THERAPY DISCHARGE SUMMARY  Visits from Start of Care: 22  Current functional level related to goals / functional outcomes:     PT Long Term Goals - 12/05/14 1049    PT LONG TERM GOAL #1   Title Pt will verbalize understanding of CVA symptoms and risk factors to reduce risk of future CVA. Target date: 12/07/14.   Status Partially Met   PT LONG  TERM GOAL #2   Title Pt will ambulate 600' over even/uneven terrain with LRAD at MOD I level to improve functional mobility and to play with her grandchildren. Target dat: 12/07/14.   Status Achieved   PT LONG TERM GOAL #3   Title Pt will improve BERG score to >/=48/56 to decrease falls  risk. Target date: 11/04/14.   Status Achieved   PT LONG TERM GOAL #4   Title Pt will improve her gait speed with LRAD to >/=1.75ft/sec. to reduce falls risk. Target date: 12/07/14.   Status Not Met   PT LONG TERM GOAL #5   Title Pt will improve FOTO score by 10 points to improve quality of life. Target date: 12/07/14.   Status Not Met   PT LONG TERM GOAL #6   Title Pt will be able to peform reaching outside BOS activities (5 inches) while standing with feet apart for 5 minutes,without LOB, at MOD I level in order to cook at home. Target date: 12/07/14.   Status Achieved        Remaining deficits: L sided weakness   Education / Equipment: HEP and foot up brace recommended  Plan: Patient agrees to discharge.  Patient goals were met. Patient is being discharged due to meeting the stated rehab goals.  ?????        Geoffry Paradise, PT,DPT 12/05/2014 11:00 AM Phone: [phone] Fax: [phone]

## 2016-09-15 ENCOUNTER — Other Ambulatory Visit: Payer: Self-pay | Admitting: Internal Medicine

## 2016-09-15 DIAGNOSIS — Z1231 Encounter for screening mammogram for malignant neoplasm of breast: Secondary | ICD-10-CM

## 2016-10-11 ENCOUNTER — Ambulatory Visit
Admission: RE | Admit: 2016-10-11 | Discharge: 2016-10-11 | Disposition: A | Discharge status: Home / Self Care | Payer: No Typology Code available for payment source | Source: Ambulatory Visit | Attending: Internal Medicine | Admitting: Internal Medicine

## 2016-10-11 DIAGNOSIS — Z1231 Encounter for screening mammogram for malignant neoplasm of breast: Secondary | ICD-10-CM

## 2017-12-31 ENCOUNTER — Encounter: Payer: Self-pay | Admitting: Internal Medicine

## 2018-03-05 ENCOUNTER — Other Ambulatory Visit: Payer: Self-pay | Admitting: Internal Medicine

## 2018-03-05 DIAGNOSIS — E2839 Other primary ovarian failure: Secondary | ICD-10-CM

## 2018-04-09 ENCOUNTER — Other Ambulatory Visit: Payer: Self-pay | Admitting: Internal Medicine

## 2018-04-09 DIAGNOSIS — Z1231 Encounter for screening mammogram for malignant neoplasm of breast: Secondary | ICD-10-CM

## 2018-05-11 ENCOUNTER — Ambulatory Visit
Admission: RE | Admit: 2018-05-11 | Discharge: 2018-05-11 | Disposition: A | Discharge status: Home / Self Care | Payer: BLUE CROSS/BLUE SHIELD | Source: Ambulatory Visit | Attending: Internal Medicine | Admitting: Internal Medicine

## 2018-05-11 DIAGNOSIS — E2839 Other primary ovarian failure: Secondary | ICD-10-CM

## 2018-05-21 ENCOUNTER — Ambulatory Visit
Admission: RE | Admit: 2018-05-21 | Discharge: 2018-05-21 | Disposition: A | Discharge status: Home / Self Care | Payer: BLUE CROSS/BLUE SHIELD | Source: Ambulatory Visit | Attending: Internal Medicine | Admitting: Internal Medicine

## 2018-05-21 DIAGNOSIS — Z1231 Encounter for screening mammogram for malignant neoplasm of breast: Secondary | ICD-10-CM

## 2018-06-03 ENCOUNTER — Encounter: Payer: Self-pay | Admitting: Gastroenterology

## 2018-07-04 ENCOUNTER — Emergency Department (HOSPITAL_COMMUNITY)
Admission: EM | Admit: 2018-07-04 | Discharge: 2018-07-05 | Disposition: A | Discharge status: Home / Self Care | Payer: BLUE CROSS/BLUE SHIELD | Attending: Emergency Medicine | Admitting: Emergency Medicine

## 2018-07-04 ENCOUNTER — Emergency Department (HOSPITAL_COMMUNITY): Payer: BLUE CROSS/BLUE SHIELD

## 2018-07-04 ENCOUNTER — Encounter (HOSPITAL_COMMUNITY): Payer: Self-pay | Admitting: Emergency Medicine

## 2018-07-04 DIAGNOSIS — R0602 Shortness of breath: Secondary | ICD-10-CM | POA: Diagnosis present

## 2018-07-04 DIAGNOSIS — I10 Essential (primary) hypertension: Secondary | ICD-10-CM | POA: Diagnosis not present

## 2018-07-04 DIAGNOSIS — J181 Lobar pneumonia, unspecified organism: Secondary | ICD-10-CM

## 2018-07-04 DIAGNOSIS — R509 Fever, unspecified: Secondary | ICD-10-CM

## 2018-07-04 DIAGNOSIS — Z8673 Personal history of transient ischemic attack (TIA), and cerebral infarction without residual deficits: Secondary | ICD-10-CM | POA: Diagnosis not present

## 2018-07-04 DIAGNOSIS — J069 Acute upper respiratory infection, unspecified: Secondary | ICD-10-CM | POA: Insufficient documentation

## 2018-07-04 DIAGNOSIS — Z79899 Other long term (current) drug therapy: Secondary | ICD-10-CM | POA: Diagnosis not present

## 2018-07-04 DIAGNOSIS — J189 Pneumonia, unspecified organism: Secondary | ICD-10-CM

## 2018-07-04 LAB — BASIC METABOLIC PANEL
Anion gap: 10 (ref 5–15)
BUN: 6 mg/dL — ABNORMAL LOW (ref 8–23)
CO2: 24 mmol/L (ref 22–32)
Calcium: 10 mg/dL (ref 8.9–10.3)
Chloride: 103 mmol/L (ref 98–111)
Creatinine, Ser: 0.76 mg/dL (ref 0.44–1.00)
GFR calc Af Amer: >60 mL/min (ref >60)
GFR calc non Af Amer: >60 mL/min (ref >60)
Glucose, Bld: 145 mg/dL — ABNORMAL HIGH (ref 70–99)
Potassium: 3.9 mmol/L (ref 3.5–5.1)
Sodium: 137 mmol/L (ref 135–145)

## 2018-07-04 LAB — I-STAT TROPONIN, ED: TROPONIN I, POC: 0.01 ng/mL (ref 0.00–0.08)

## 2018-07-04 LAB — CBC
HCT: 41.7 % (ref 36.0–46.0)
Hemoglobin: 12.6 g/dL (ref 12.0–15.0)
MCH: 27 pg (ref 26.0–34.0)
MCHC: 30.2 g/dL (ref 30.0–36.0)
MCV: 89.3 fL (ref 80.0–100.0)
PLATELETS: 255 10*3/uL (ref 150–400)
RBC: 4.67 MIL/uL (ref 3.87–5.11)
RDW: 13.8 % (ref 11.5–15.5)
WBC: 10 10*3/uL (ref 4.0–10.5)
nRBC: 0 % (ref 0.0–0.2)

## 2018-07-04 MED ORDER — IPRATROPIUM-ALBUTEROL 0.5-2.5 (3) MG/3ML IN SOLN
3.0000 mL | Freq: Once | RESPIRATORY_TRACT | Status: AC
  Administered 2018-07-04: 3 mL via RESPIRATORY_TRACT
  Filled 2018-07-04: qty 3

## 2018-07-04 MED ORDER — ACETAMINOPHEN 500 MG PO TABS
1000.0000 mg | ORAL_TABLET | Freq: Once | ORAL | Status: AC
  Administered 2018-07-04: 1000 mg via ORAL
  Filled 2018-07-04: qty 2

## 2018-07-04 MED ORDER — SODIUM CHLORIDE 0.9% FLUSH
3.0000 mL | Freq: Once | INTRAVENOUS | Status: AC
  Administered 2018-07-05: 3 mL via INTRAVENOUS

## 2018-07-04 NOTE — ED Triage Notes (Signed)
Pt reports SOB X1 day. Denies cough/fever but noted to be febrile in triage.

## 2018-07-04 NOTE — ED Notes (Signed)
ED Provider at bedside. 

## 2018-07-04 NOTE — ED Provider Notes (Addendum)
[facility] EMERGENCY DEPARTMENT Provider Note   CSN: [id number] Arrival date & time: 07/04/18  2127     History   Chief Complaint Chief Complaint  Patient presents with  . Shortness of Breath    HPI Erica Cooper is a 68 y.o. female.  The history is provided by the patient.  URI  Presenting symptoms: congestion, cough and fever   Presenting symptoms: no ear pain and no sore throat   Severity:  Mild Onset quality:  Gradual Duration:  2 days Timing:  Intermittent Progression:  Waxing and waning Chronicity:  New Relieved by:  Nothing Worsened by:  Nothing Associated symptoms: myalgias and wheezing   Associated symptoms: no arthralgias, no headaches, no neck pain, no sinus pain, no sneezing and no swollen glands   Risk factors: being elderly and sick contacts     Past Medical History:  Diagnosis Date  . Diabetes ([place])    borderline/ no meds  . GERD (gastroesophageal reflux disease)   . GI bleeding 09/2014   after colon polypectomy  . Hx of adenomatous colonic polyps 10/07/2014  . Hyperlipidemia   . Hypertension   . Obesity   . Stroke [facility]) 2007   weakness left side    Patient Active Problem List   Diagnosis Date Noted  . Hx of adenomatous colonic polyps 10/07/2014  . Dyspnea 08/27/2013  . Abnormal ECG 08/27/2013  . Stroke ([place])   . Hypertension   . Diabetes ([place])   . Obesity   . Hyperlipidemia     Past Surgical History:  Procedure Laterality Date  . COLONOSCOPY    . COLONOSCOPY N/A 10/08/2014   Procedure: COLONOSCOPY;  Surgeon: Gatha Mayer, MD;  Location: [facility];  Service: Endoscopy;  Laterality: N/A;     OB History   No obstetric history on file.      Home Medications    Prior to Admission medications   Medication Sig Start Date End Date Taking? Authorizing Provider  amLODipine (NORVASC) 5 MG tablet Take 5 mg by mouth daily.   Yes [provider]  atorvastatin (LIPITOR) 10 MG tablet Take 10 mg by  mouth daily at 6 PM.    Yes [provider]  lisinopril (PRINIVIL,ZESTRIL) 10 MG tablet Take 10 mg by mouth daily.   Yes [provider]    Family History Family History  Problem Relation Age of Onset  . Stroke Mother   . Asthma Father   . Hypertension Brother   . Breast cancer Grandchild     Social History Social History   Tobacco Use  . Smoking status: Never Smoker  . Smokeless tobacco: Never Used  Substance Use Topics  . Alcohol use: No  . Drug use: No     Allergies   Tramadol and Oatmeal   Review of Systems Review of Systems  Constitutional: Positive for fever. Negative for chills.  HENT: Positive for congestion. Negative for ear pain, sinus pain, sneezing and sore throat.   Eyes: Negative for pain and visual disturbance.  Respiratory: Positive for cough and wheezing. Negative for shortness of breath.   Cardiovascular: Negative for chest pain and palpitations.  Gastrointestinal: Negative for abdominal pain and vomiting.  Genitourinary: Negative for dysuria and hematuria.  Musculoskeletal: Positive for myalgias. Negative for arthralgias, back pain and neck pain.  Skin: Negative for color change and rash.  Neurological: Negative for seizures, syncope and headaches.  All other systems reviewed and are negative.    Physical Exam Updated  Vital Signs  ED Triage Vitals  Enc Vitals Group     BP 07/04/18 2133 (!) 158/89     Pulse Rate 07/04/18 2133 95     Resp 07/04/18 2133 18     Temp 07/04/18 2133 (!) 101.2 F (38.4 C)     Temp Source 07/04/18 2133 Oral     SpO2 07/04/18 2133 91 %     Weight 07/04/18 2134 170 lb (77.1 kg)     Height 07/04/18 2134 5\' 6"  (1.676 m)     Head Circumference --      Peak Flow --      Pain Score --      Pain Loc --      Pain Edu? --      Excl. in [place]? --     Physical Exam Vitals signs and nursing note reviewed.  Constitutional:      General: She is not in acute distress.    Appearance: She is  well-developed. She is not ill-appearing.  HENT:     Head: Normocephalic and atraumatic.     Mouth/Throat:     Mouth: Mucous membranes are moist.  Eyes:     Conjunctiva/sclera: Conjunctivae normal.     Pupils: Pupils are equal, round, and reactive to light.  Neck:     Musculoskeletal: Normal range of motion and neck supple.  Cardiovascular:     Rate and Rhythm: Normal rate and regular rhythm.     Pulses: Normal pulses.     Heart sounds: Normal heart sounds. No murmur.  Pulmonary:     Effort: Pulmonary effort is normal. No respiratory distress.     Breath sounds: Normal breath sounds. No decreased breath sounds, wheezing, rhonchi or rales.  Abdominal:     Palpations: Abdomen is soft.     Tenderness: There is no abdominal tenderness.  Musculoskeletal:     Right lower leg: She exhibits no tenderness.     Left lower leg: She exhibits no tenderness.  Skin:    General: Skin is warm and dry.     Capillary Refill: Capillary refill takes less than 2 seconds.  Neurological:     Mental Status: She is alert.  Psychiatric:        Mood and Affect: Mood normal.      ED Treatments / Results  Labs (all labs ordered are listed, but only abnormal results are displayed) Labs Reviewed  BASIC METABOLIC PANEL - Abnormal; Notable for the following components:      Result Value   Glucose, Bld 145 (*)    BUN 6 (*)    All other components within normal limits  URINALYSIS, ROUTINE W REFLEX MICROSCOPIC - Abnormal; Notable for the following components:   Color, Urine STRAW (*)    Specific Gravity, Urine 1.003 (*)    Hgb urine dipstick SMALL (*)    Bacteria, UA MANY (*)    All other components within normal limits  CBC  INFLUENZA PANEL BY PCR (TYPE A & B)  I-STAT TROPONIN, ED    EKG EKG Interpretation  Date/Time:  Wednesday July 04 2018 21:33:48 EST Ventricular Rate:  95 PR Interval:  130 QRS Duration: 70 QT Interval:  316 QTC Calculation: 397 R Axis:   1 Text Interpretation:   Normal sinus rhythm Nonspecific ST and T wave abnormality Abnormal ECG Confirmed by Lennice Sites [phone]) on 07/04/2018 10:27:54 PM   Radiology Dg Chest 2 View  Result Date: 07/04/2018 CLINICAL DATA:  Shortness of breath today. Left-sided  paralysis from a stroke in 2007. History of hypertension and diabetes. EXAM: CHEST - 2 VIEW COMPARISON:  07/13/2013 FINDINGS: Heart size and pulmonary vascularity are normal. Linear atelectasis in the lung bases. No focal consolidation or airspace disease. No blunting of costophrenic angles. No pneumothorax. Mediastinal contours appear intact. Calcified and tortuous aorta. Degenerative changes in the spine and shoulders. IMPRESSION: Linear atelectasis in the lung bases. No focal consolidation. Electronically Signed   By: Lucienne Capers M.D.   On: 07/04/2018 22:33    Procedures Procedures (including critical care time)  Medications Ordered in ED Medications  sodium chloride flush (NS) 0.9 % injection 3 mL (has no administration in time range)  acetaminophen (TYLENOL) tablet 1,000 mg (1,000 mg Oral Given 07/04/18 2301)  ipratropium-albuterol (DUONEB) 0.5-2.5 (3) MG/3ML nebulizer solution 3 mL (3 mLs Nebulization Given 07/04/18 2234)     Initial Impression / Assessment and Plan / ED Course  I have reviewed the triage vital signs and the nursing notes.  Pertinent labs & imaging results that were available during my care of the patient were reviewed by me and considered in my medical decision making (see chart for details).     Erica Cooper is a 68 year old female with history of hypertension, diabetes, high cholesterol, stroke with residual left-sided weakness who presents to the ED with fever, cough.  Patient with fever but otherwise unremarkable vitals. Breathing well on room air.  No signs of respiratory distress.  She states that she intermittently uses albuterol but does not have any COPD or asthma.  Does not smoke.  Patient with multiple sick  contacts at home with similar symptoms.  Has had a cough with some sputum production for the last 2 days.  Has overall clear breath sounds on exam.  Overall sounds like patient has viral process.  Urinalysis showed no signs of infection.  Chest x-ray showed no obvious pneumonia, pneumothorax, pleural effusion.  Patient with no significant leukocytosis, anemia, electrolyte abnormality. Patient was given Tylenol with overall improvement.  EKG showed sinus rhythm.  Troponin was ordered prior to my evaluation it was unremarkable.  Pt awaiting influenza testing and re-eval and ambulation with pulse ox. Suspect viral process, possible mild reactive airway disease. Signed out to oncoming ED staff, with labs, re-eval pending  This chart was dictated using voice recognition software.  Despite best efforts to proofread,  errors can occur which can change the documentation meaning.    Final Clinical Impressions(s) / ED Diagnoses   Final diagnoses:  Upper respiratory tract infection, unspecified type  Fever in adult    ED Discharge Orders    None       Lennice Sites, DO 07/05/18 0025    Lennice Sites, DO 07/05/18 0157

## 2018-07-05 ENCOUNTER — Emergency Department (HOSPITAL_COMMUNITY): Payer: BLUE CROSS/BLUE SHIELD

## 2018-07-05 LAB — URINALYSIS, ROUTINE W REFLEX MICROSCOPIC
Bilirubin Urine: NEGATIVE
Glucose, UA: NEGATIVE mg/dL
Ketones, ur: NEGATIVE mg/dL
Leukocytes, UA: NEGATIVE
Nitrite: NEGATIVE
Protein, ur: NEGATIVE mg/dL
Specific Gravity, Urine: 1.003 — ABNORMAL LOW (ref 1.005–1.030)
pH: 6 (ref 5.0–8.0)

## 2018-07-05 LAB — D-DIMER, QUANTITATIVE: D-Dimer, Quant: 1.17 ug/mL-FEU — ABNORMAL HIGH (ref 0.00–0.50)

## 2018-07-05 LAB — INFLUENZA PANEL BY PCR (TYPE A & B)
Influenza A By PCR: NEGATIVE
Influenza B By PCR: NEGATIVE

## 2018-07-05 MED ORDER — DOXYCYCLINE HYCLATE 100 MG PO TABS
100.0000 mg | ORAL_TABLET | Freq: Once | ORAL | Status: AC
  Administered 2018-07-05: 100 mg via ORAL
  Filled 2018-07-05: qty 1

## 2018-07-05 MED ORDER — AMOXICILLIN-POT CLAVULANATE 875-125 MG PO TABS
1.0000 | ORAL_TABLET | Freq: Once | ORAL | Status: AC
  Administered 2018-07-05: 1 via ORAL
  Filled 2018-07-05: qty 1

## 2018-07-05 MED ORDER — IPRATROPIUM-ALBUTEROL 0.5-2.5 (3) MG/3ML IN SOLN
3.0000 mL | Freq: Once | RESPIRATORY_TRACT | Status: AC
  Administered 2018-07-05: 3 mL via RESPIRATORY_TRACT
  Filled 2018-07-05: qty 3

## 2018-07-05 MED ORDER — IOPAMIDOL (ISOVUE-370) INJECTION 76%
INTRAVENOUS | Status: AC
  Administered 2018-07-05: 100 mL
  Filled 2018-07-05: qty 100

## 2018-07-05 MED ORDER — AMOXICILLIN-POT CLAVULANATE 875-125 MG PO TABS: 1.0000 | ORAL_TABLET | Freq: Two times a day (BID) | ORAL | 0 refills | Status: AC

## 2018-07-05 MED ORDER — DOXYCYCLINE HYCLATE 100 MG PO CAPS: 100.0000 mg | ORAL_CAPSULE | Freq: Two times a day (BID) | ORAL | 0 refills | Status: AC

## 2018-07-05 NOTE — ED Provider Notes (Signed)
Care assumed from Dr. Ronnald Nian.  Patient with 1 day of cough, congestion, shortness of breath and fever.  No pneumonia seen on x-ray.  Awaiting flu swab.  Patient resting comfortably without hypoxia.  Lungs are clear.  Flu swab is negative.  Chest x-ray shows no pneumonia.  Patient ambulatory and tolerating p.o.  No hypoxia with ambulation.  CT scan shows no pulmonary embolism but does show areas concerning for pneumonia and will treat with antibiotics.  PSI score 69. Patient wishes to go home.  No increased work of breathing and no hypoxia.  Family is comfortable with this discharge plan.  We will give antibiotics for home use.  Return precautions discussed including chest pain, difficulty breathing, mental status change or any other concerns.  BP (!) 144/82   Pulse 81   Temp 99.1 F (37.3 C) (Oral)   Resp (!) 24   Ht 5\' 6"  (1.676 m)   Wt 77.1 kg   SpO2 95%   BMI 27.44 kg/m     Erica Essex, MD 07/05/18 [phone]

## 2018-07-05 NOTE — ED Notes (Signed)
Patient transported to CT 

## 2018-07-05 NOTE — Discharge Instructions (Addendum)
Take antibiotics as prescribed.Continue Tylenol and Motrin for fever.  Stay well-hydrated.  Follow-up with your primary care doctor return to the ED symptoms worsen. Return to the ED with chest pain, shortness of breath, any other concerns.

## 2018-07-05 NOTE — ED Notes (Signed)
ED Provider at bedside. 

## 2018-07-05 NOTE — ED Notes (Addendum)
Reviewed d/c instructions with pt, who verbalized understanding and had no outstanding questions. Pt departed in NAD.   

## 2018-07-05 NOTE — ED Notes (Addendum)
Pt's pulse ox maintained between 91-94% on room air while ambulating

## 2018-07-05 NOTE — ED Notes (Signed)
Pt given water, able to keep down with no vomiting

## 2019-02-19 ENCOUNTER — Other Ambulatory Visit: Payer: Self-pay | Admitting: Internal Medicine

## 2019-02-19 DIAGNOSIS — Z1231 Encounter for screening mammogram for malignant neoplasm of breast: Secondary | ICD-10-CM

## 2019-05-27 ENCOUNTER — Other Ambulatory Visit: Payer: Self-pay

## 2019-05-27 ENCOUNTER — Ambulatory Visit
Admission: RE | Admit: 2019-05-27 | Discharge: 2019-05-27 | Disposition: A | Discharge status: Home / Self Care | Payer: BLUE CROSS/BLUE SHIELD | Source: Ambulatory Visit | Attending: Internal Medicine | Admitting: Internal Medicine

## 2019-05-27 DIAGNOSIS — Z1231 Encounter for screening mammogram for malignant neoplasm of breast: Secondary | ICD-10-CM

## 2020-05-01 ENCOUNTER — Other Ambulatory Visit: Payer: Self-pay | Admitting: Internal Medicine

## 2020-05-01 DIAGNOSIS — Z1231 Encounter for screening mammogram for malignant neoplasm of breast: Secondary | ICD-10-CM

## 2020-06-12 ENCOUNTER — Other Ambulatory Visit: Payer: Self-pay

## 2020-06-12 ENCOUNTER — Ambulatory Visit: Payer: Medicaid Other

## 2020-06-13 ENCOUNTER — Other Ambulatory Visit: Payer: Self-pay | Admitting: Internal Medicine

## 2020-06-13 DIAGNOSIS — E2839 Other primary ovarian failure: Secondary | ICD-10-CM

## 2020-07-24 ENCOUNTER — Ambulatory Visit: Payer: Medicaid Other

## 2020-07-29 ENCOUNTER — Other Ambulatory Visit: Payer: Self-pay

## 2020-07-29 ENCOUNTER — Ambulatory Visit: Payer: Medicaid Other | Attending: Internal Medicine

## 2020-07-29 VITALS — BP 124/72

## 2020-07-29 DIAGNOSIS — R278 Other lack of coordination: Secondary | ICD-10-CM | POA: Diagnosis present

## 2020-07-29 DIAGNOSIS — M6281 Muscle weakness (generalized): Secondary | ICD-10-CM | POA: Diagnosis present

## 2020-07-29 DIAGNOSIS — I69354 Hemiplegia and hemiparesis following cerebral infarction affecting left non-dominant side: Secondary | ICD-10-CM

## 2020-07-29 DIAGNOSIS — R2689 Other abnormalities of gait and mobility: Secondary | ICD-10-CM | POA: Diagnosis present

## 2020-07-30 ENCOUNTER — Ambulatory Visit
Admission: RE | Admit: 2020-07-30 | Discharge: 2020-07-30 | Disposition: A | Discharge status: Home / Self Care | Payer: Medicaid Other | Source: Ambulatory Visit | Attending: Internal Medicine | Admitting: Internal Medicine

## 2020-07-30 ENCOUNTER — Telehealth: Payer: Self-pay

## 2020-07-30 DIAGNOSIS — Z1231 Encounter for screening mammogram for malignant neoplasm of breast: Secondary | ICD-10-CM

## 2020-07-30 NOTE — Therapy (Signed)
[facility] [address] [place], [place], [address] Phone: [phone]   Fax:  [phone]  Physical Therapy Evaluation  Patient Details  Name: Erica Cooper MRN: [id number] Date of Birth: 1951-02-26 Referring Provider (PT): Nolene Ebbs   Encounter Date: 07/29/2020   PT End of Session - 07/29/20 1040    Visit Number 1    Number of Visits 9    Authorization Type [employer] medicaid    PT Start Time 1017    PT Stop Time 1058    PT Time Calculation (min) 41 min    Equipment Utilized During Treatment Gait belt    Activity Tolerance Patient tolerated treatment well           Past Medical History:  Diagnosis Date  . Diabetes ([place])    borderline/ no meds  . GERD (gastroesophageal reflux disease)   . GI bleeding 09/2014   after colon polypectomy  . Hx of adenomatous colonic polyps 10/07/2014  . Hyperlipidemia   . Hypertension   . Obesity   . Stroke [facility]) 2007   weakness left side    Past Surgical History:  Procedure Laterality Date  . COLONOSCOPY    . COLONOSCOPY N/A 10/08/2014   Procedure: COLONOSCOPY;  Surgeon: Gatha Mayer, MD;  Location: [facility];  Service: Endoscopy;  Laterality: N/A;    Vitals:   07/29/20 1055  BP: 124/72      Subjective Assessment - 07/29/20 1027    Subjective Pt was referred for CVA. Pt's stroke was in 2007. She reports that she has left sided weakness. She has trouble with weakness in left hand using it as well as weakness in left leg causes her to drag it. Pt reports that she had therapy for her stroke in past but was cut off due to insurance in past. She had some PT in 2010 when she first came from [place] for a few weeks then a few years later a bit but feels she has declined some since then. She also feels that she is more tired.    Patient is accompained by: Family member   husband and son   Patient Stated Goals Pt would like to get stronger to walk better     Currently in Pain? No/denies              [facility] PT Assessment - 07/29/20 1033      Assessment   Medical Diagnosis CVA    Referring Provider (PT) Nolene Ebbs    Onset Date/Surgical Date 07/22/20   referral date as CVA in 2007   Hand Dominance Right    Prior Therapy in years past she had a little      Precautions   Precautions Fall      Balance Screen   Has the patient fallen in the past 6 months No    Has the patient had a decrease in activity level because of a fear of falling?  Yes    Is the patient reluctant to leave their home because of a fear of falling?  Yes      [place] Private residence    Living Arrangements Spouse/significant other;Children   grandchildren   Available Help at Discharge Family    Type of [place] to enter    Entrance Stairs-Number of Steps 4    Entrance Stairs-Rails [place] One level    Home  Equipment Kasandra Knudsen - single point      Prior Function   Level of Independence Independent with household mobility with device;Needs assistance with ADLs    Vocation Retired    Leisure Engineer, agricultural   Overall Cognitive Status Within Functional Limits for tasks assessed      Sensation   Light Touch Appears Intact    Additional Comments intact in BUE and BLE to light touch      Tone   Assessment Location Left Upper Extremity;Left Lower Extremity      ROM / Strength   AROM / PROM / Strength Strength      Strength   Strength Assessment Site Shoulder;Elbow;Hand;Hip;Knee;Ankle    Right/Left Shoulder Right;Left    Right Shoulder Flexion 5/5    Left Shoulder Flexion 2-/5    Right/Left Elbow Right;Left    Right Elbow Flexion 5/5    Right Elbow Extension 5/5    Left Elbow Flexion 2-/5    Left Elbow Extension 2-/5    Right/Left hand Right;Left    Right Hand Gross Grasp Functional    Left Hand Gross Grasp Impaired    Right/Left Hip Right;Left    Right Hip Flexion 5/5    Left  Hip Flexion 2-/5    Right/Left Knee Right;Left    Right Knee Flexion 5/5    Right Knee Extension 5/5    Left Knee Flexion 2-/5    Left Knee Extension 3+/5    Right/Left Ankle Right;Left    Right Ankle Dorsiflexion 5/5    Left Ankle Dorsiflexion 3/5   within available range to about 0 degrees     Transfers   Transfers Sit to Stand;Stand to Sit    Sit to Stand 5: Supervision;4: Min guard    Five time sit to stand comments  30.79 sec without hands    Stand to Sit 5: Supervision      Ambulation/Gait   Ambulation/Gait Yes    Ambulation/Gait Assistance 4: Min guard    Ambulation Distance (Feet) 100 Feet    Assistive device None    Gait Pattern Step-to pattern;Decreased stance time - left;Decreased hip/knee flexion - left;Decreased dorsiflexion - left;Poor foot clearance - left    Ambulation Surface Level;Indoor    Gait velocity 21.5 sec=0.58m/s      Standardized Balance Assessment   Standardized Balance Assessment Timed Up and Go Test      Timed Up and Go Test   TUG Normal TUG    Normal TUG (seconds) 19.07      LUE Tone   LUE Tone Moderate      LLE Tone   LLE Tone Within Functional Limits                      Objective measurements completed on examination: See above findings.               PT Education - 07/29/20 1553    Education Details Discussed PT plan of care. Will request OT referral to address left arm/hand    Person(s) Educated Patient;Spouse;Child(ren)    Methods Explanation    Comprehension Verbalized understanding            PT Short Term Goals - 07/30/20 3888      PT SHORT TERM GOAL #1   Title Pt will be independent with initial HEP for strengthening, flexibility and balance.    Baseline no current HEP    Time 4  Period Weeks    Status New    Target Date 08/29/20      PT SHORT TERM GOAL #2   Title Pt will decrease TUG form 19.07 sec to <16 sec for improved balance and functional mobility.    Baseline 07/29/20 19.07 sec     Time 4    Period Weeks    Status New    Target Date 08/29/20      PT SHORT TERM GOAL #3   Title Pt will ambulate >300' on level surfaces without AD independently for improved household and short community distances.    Baseline 07/29/20 100' CGA with decreased left foot clearance.    Time 4    Period Weeks    Status New    Target Date 08/29/20      PT SHORT TERM GOAL #4   Title Berg Balance will be assessed to further assess balance.    Baseline TBD    Time 4    Period Weeks    Status New    Target Date 08/29/20             PT Long Term Goals - 07/30/20 0826      PT LONG TERM GOAL #1   Title Pt will be independent with progressive HEP for strength, flexibility and balance to continue gains on own.    Baseline no current HEP    Time 8    Period Weeks    Status New    Target Date 09/28/20      PT LONG TERM GOAL #2   Title Pt will ambulate >600' on varied surfaces with LRAD versus no device mod I for improved community mobility.    Baseline 100' level surfaces CGA    Time 8    Period Weeks    Status New    Target Date 09/28/20      PT LONG TERM GOAL #3   Title Pt will increase gait speed from 0.30m/s to >0.45m/s for improved mobility.    Baseline 07/29/20 0.43m/s without AD    Time 8    Period Weeks    Status New    Target Date 09/28/20      PT LONG TERM GOAL #4   Title Pt will decrease 5 x sit to stand from 30.79 sec to <22 sec for improved balance and functional strength.    Baseline 07/29/20 30.79 sec from chair without hands    Time 8    Period Weeks    Status New    Target Date 09/28/20                  Plan - 07/29/20 1554    Clinical Impression Statement Pt is 70 y/o female with history of CVA in 2007 with left hemiparesis with limited therapy in the past. Pt 2-/5 strength with left hip and knee flexion, 3/5 left ankle DF within available range. Pt is ambulating without AD with decreased gait speed of 0.40m/s which indicates decreased safety with  community mobility. Pt has difficulty clearing left foot with decreased left hip flexion. Pt is fall risk based on 5 x sit to stand of 30.79 sec and TUG of 19.07 sec. Pt will benefit from skilled PT to address strength, balance and functional mobility deficits to maximize function. PT will also request OT order to address left UE weakness to assist more with ADLs.    Personal Factors and Comorbidities Comorbidity 3+;Time since onset of injury/illness/exacerbation    Comorbidities  DM, stroke 2007, HTN, OA    Examination-Activity Limitations Stairs;Locomotion Level;Stand;Transfers;Dressing    Examination-Participation Restrictions Community Activity;Cleaning;Laundry    Stability/Clinical Decision Making Evolving/Moderate complexity    Clinical Decision Making Moderate    Rehab Potential Good    PT Frequency 1x / week   plus eval   PT Duration 8 weeks    PT Treatment/Interventions ADLs/Self Care Home Management;DME Instruction;Therapeutic activities;Functional mobility training;Gait training;Stair training;Therapeutic exercise;Balance training;Neuromuscular re-education;Orthotic Fit/Training;Patient/family education;Manual techniques;Passive range of motion;Vestibular    PT Next Visit Plan Issue initial HEP for LLE strengthening. Needs more hip/knee flexion to help clear foot. Do we need to trial AFO or is issue more from hip? Assess Berg and write LTG.    Consulted and Agree with Plan of Care Patient;Family member/caregiver    Family Member Consulted husband and son           Patient will benefit from skilled therapeutic intervention in order to improve the following deficits and impairments:  Abnormal gait,Decreased activity tolerance,Decreased balance,Decreased endurance,Decreased knowledge of use of DME,Decreased strength,Impaired tone,Decreased mobility,Decreased range of motion,Impaired flexibility  Visit Diagnosis: Other abnormalities of gait and mobility  Muscle weakness  (generalized)  Hemiplegia and hemiparesis following cerebral infarction affecting left non-dominant side [facility])     Problem List Patient Active Problem List   Diagnosis Date Noted  . Hx of adenomatous colonic polyps 10/07/2014  . Dyspnea 08/27/2013  . Abnormal ECG 08/27/2013  . Stroke ([place])   . Hypertension   . Diabetes ([place])   . Obesity   . Hyperlipidemia     Electa Sniff, PT, DPT, NCS 07/30/2020, 8:30 AM  [facility] [address] [place], [place], [address] Phone: [phone]   Fax:  [phone]  Name: Avantika Shere MRN: [id number] Date of Birth: 04/12/1951  [place]   Choose one: Neuro Rehabilitative  Standardized Assessment or Functional Outcome Tool: See Pain Assessment, TUG, 5x sit to stand and Other gait speed  Score or Percent Disability: TUG=19.07 sec, 5 x sit to stand=30.79 sec, gait speed=0.22m/s  Body Parts Treated (Select each separately):  1. Other CVA with left hemiparesis. Overall deficits/functional limitations for body part selected: moderate 2. N/A. Overall deficits/functional limitations for body part selected: mild 3. N/A. Overall deficits/functional limitations for body part selected: mild

## 2020-07-30 NOTE — Telephone Encounter (Signed)
Dr. Jeanie Cooks, Erica Cooper was evaluated by PT on 07/29/20.  The patient would benefit from OT  evaluation for left UE hemiparesis and ADL training.   If you agree, please place an order in [facility]que in [facility] or fax the order to [phone]. Thank you, Erica Cooper, PT, DPT, [facility] [address] [place], [place]  [address] Phone:  [phone] Fax:  [phone]

## 2020-08-06 ENCOUNTER — Ambulatory Visit: Payer: Medicaid Other

## 2020-08-06 ENCOUNTER — Ambulatory Visit: Payer: Medicaid Other | Admitting: Occupational Therapy

## 2020-08-06 ENCOUNTER — Other Ambulatory Visit: Payer: Self-pay

## 2020-08-06 ENCOUNTER — Encounter: Payer: Self-pay | Admitting: Occupational Therapy

## 2020-08-06 DIAGNOSIS — M6281 Muscle weakness (generalized): Secondary | ICD-10-CM

## 2020-08-06 DIAGNOSIS — R2689 Other abnormalities of gait and mobility: Secondary | ICD-10-CM

## 2020-08-06 DIAGNOSIS — R278 Other lack of coordination: Secondary | ICD-10-CM

## 2020-08-06 DIAGNOSIS — I69354 Hemiplegia and hemiparesis following cerebral infarction affecting left non-dominant side: Secondary | ICD-10-CM

## 2020-08-06 NOTE — Therapy (Signed)
[facility] [address] [place], [place], [address] Phone: [phone]   Fax:  [phone]  Physical Therapy Treatment  Patient Details  Name: Erica Cooper MRN: [id number] Date of Birth: 1950-08-24 Referring Provider (PT): Nolene Ebbs   Encounter Date: 08/06/2020   PT End of Session - 08/06/20 0845    Visit Number 2    Number of Visits 9    Authorization Type [employer] medicaid    PT Start Time [phone]    PT Stop Time 0930    PT Time Calculation (min) 46 min    Equipment Utilized During Treatment Gait belt    Activity Tolerance Patient tolerated treatment well           Past Medical History:  Diagnosis Date  . Diabetes ([place])    borderline/ no meds  . GERD (gastroesophageal reflux disease)   . GI bleeding 09/2014   after colon polypectomy  . Hx of adenomatous colonic polyps 10/07/2014  . Hyperlipidemia   . Hypertension   . Obesity   . Stroke [facility]) 2007   weakness left side    Past Surgical History:  Procedure Laterality Date  . COLONOSCOPY    . COLONOSCOPY N/A 10/08/2014   Procedure: COLONOSCOPY;  Surgeon: Gatha Mayer, MD;  Location: [place];  Service: Endoscopy;  Laterality: N/A;    There were no vitals filed for this visit.   Subjective Assessment - 08/06/20 0846    Subjective Pt denies any changes. Reports since her eye surgery she has a little pain around right eye first thing in the morning.    Patient is accompained by: Family member   husband and son   Patient Stated Goals Pt would like to get stronger to walk better    Currently in Pain? No/denies                             [facility] Adult PT Treatment/Exercise - 08/06/20 0846      Ambulation/Gait   Ambulation/Gait Yes    Ambulation/Gait Assistance 4: Min guard    Ambulation/Gait Assistance Details First lap PT provided tactile cues to prevent right lateral trunk lean and left hip hike while facilitating  left pelvic rotation. Second bout PT in front holding under pts forearms to facilitate trunk rotation with arm swing. Pt with less lateral trunk lean with arm support. Pt was able to demonstrate some improvement in foot clearance with more hip flexion on left.    Ambulation Distance (Feet) 115 Feet   x 2   Assistive device None    Gait Pattern Step-through pattern;Decreased stance time - left;Decreased hip/knee flexion - left;Decreased weight shift to left;Decreased dorsiflexion - left;Lateral trunk lean to right    Ambulation Surface Level;Indoor      Neuro Re-ed    Neuro Re-ed Details  Sit to stand x 5 without hands then repeated 5 x 2 with 2" step under right foot to facilitate left weight shift. Standing toe taps on 2" step with LLE x 10 with PT blocking at hips to prevent hip hike and trunk lean. Then repeated with RLE to increase LLE SLS time x 10.      Exercises   Exercises Other Exercises    Other Exercises  Supine exercises: bridges x 10 with tactile and verbal cues for form, left heel slides with manual resistance with extension x 10. Pt needed min assist at first to start to  help break through tone but as went on she was able to perform. Hooklying left hip flexion x 10 with CGA to prevent ER. Left clamshell in sidelying min assist x 10. Supine left bent knee fall out x 10.                  PT Education - 08/06/20 1314    Education Details Started on initial strengthening HEP. Pt's husband present and was instructed on how to assist with 2 of the exercises.    Person(s) Educated Patient;Spouse    Methods Explanation;Demonstration;Handout    Comprehension Verbalized understanding;Returned demonstration            PT Short Term Goals - 07/30/20 0823      PT SHORT TERM GOAL #1   Title Pt will be independent with initial HEP for strengthening, flexibility and balance.    Baseline no current HEP    Time 4    Period Weeks    Status New    Target Date 08/29/20      PT  SHORT TERM GOAL #2   Title Pt will decrease TUG form 19.07 sec to <16 sec for improved balance and functional mobility.    Baseline 07/29/20 19.07 sec    Time 4    Period Weeks    Status New    Target Date 08/29/20      PT SHORT TERM GOAL #3   Title Pt will ambulate >300' on level surfaces without AD independently for improved household and short community distances.    Baseline 07/29/20 100' CGA with decreased left foot clearance.    Time 4    Period Weeks    Status New    Target Date 08/29/20      PT SHORT TERM GOAL #4   Title Berg Balance will be assessed to further assess balance.    Baseline TBD    Time 4    Period Weeks    Status New    Target Date 08/29/20             PT Long Term Goals - 07/30/20 0826      PT LONG TERM GOAL #1   Title Pt will be independent with progressive HEP for strength, flexibility and balance to continue gains on own.    Baseline no current HEP    Time 8    Period Weeks    Status New    Target Date 09/28/20      PT LONG TERM GOAL #2   Title Pt will ambulate >600' on varied surfaces with LRAD versus no device mod I for improved community mobility.    Baseline 100' level surfaces CGA    Time 8    Period Weeks    Status New    Target Date 09/28/20      PT LONG TERM GOAL #3   Title Pt will increase gait speed from 0.56m/s to >0.4m/s for improved mobility.    Baseline 07/29/20 0.31m/s without AD    Time 8    Period Weeks    Status New    Target Date 09/28/20      PT LONG TERM GOAL #4   Title Pt will decrease 5 x sit to stand from 30.79 sec to <22 sec for improved balance and functional strength.    Baseline 07/29/20 30.79 sec from chair without hands    Time 8    Period Weeks    Status New    Target Date  09/28/20                 Plan - 08/06/20 1315    Clinical Impression Statement PT focused on establishing initial HEP for strengthening at left hip especially with hip flexion. Pt was able to demonstrate some improvement of  left foot clearance with gait after NMR activities.    Personal Factors and Comorbidities Comorbidity 3+;Time since onset of injury/illness/exacerbation    Comorbidities DM, stroke 2007, HTN, OA    Examination-Activity Limitations Stairs;Locomotion Level;Stand;Transfers;Dressing    Examination-Participation Restrictions Community Activity;Cleaning;Laundry    Stability/Clinical Decision Making Evolving/Moderate complexity    Rehab Potential Good    PT Frequency 1x / week   plus eval   PT Duration 8 weeks    PT Treatment/Interventions ADLs/Self Care Home Management;DME Instruction;Therapeutic activities;Functional mobility training;Gait training;Stair training;Therapeutic exercise;Balance training;Neuromuscular re-education;Orthotic Fit/Training;Patient/family education;Manual techniques;Passive range of motion;Vestibular    PT Next Visit Plan How is initial HEP going?  Assess Berg and write LTG. Needs more hip/knee flexion to help clear foot. Do we need to trial AFO or is issue more from hip?    Consulted and Agree with Plan of Care Patient;Family member/caregiver    Family Member Consulted husband and son           Patient will benefit from skilled therapeutic intervention in order to improve the following deficits and impairments:  Abnormal gait,Decreased activity tolerance,Decreased balance,Decreased endurance,Decreased knowledge of use of DME,Decreased strength,Impaired tone,Decreased mobility,Decreased range of motion,Impaired flexibility  Visit Diagnosis: Other abnormalities of gait and mobility  Hemiplegia and hemiparesis following cerebral infarction affecting left non-dominant side (HCC)  Muscle weakness (generalized)     Problem List Patient Active Problem List   Diagnosis Date Noted  . Hx of adenomatous colonic polyps 10/07/2014  . Dyspnea 08/27/2013  . Abnormal ECG 08/27/2013  . Stroke ([place])   . Hypertension   . Diabetes ([place])   . Obesity   . Hyperlipidemia      Electa Sniff, PT, DPT, NCS 08/06/2020, 1:18 PM  [facility] [address] [place], [place], [address] Phone: [phone]   Fax:  [phone]  Name: Meghana Tullo MRN: [id number] Date of Birth: 1951-04-20

## 2020-08-06 NOTE — Patient Instructions (Signed)
Access Code: [email or web address] URL: [email or web address] Date: 08/06/2020 Prepared by: Cherly Anderson  Exercises Supine Bridge - 2 x daily - 5 x weekly - 2 sets - 10 reps Supine Heel Slide - 2 x daily - 5 x weekly - 2 sets - 10 reps Supine March - 2 x daily - 5 x weekly - 2 sets - 10 reps Supine Single Bent Knee Fallout - 2 x daily - 5 x weekly - 2 sets - 10 reps

## 2020-08-06 NOTE — Therapy (Signed)
[facility] [address] [place], [place], [address] Phone: [phone]   Fax:  [phone]  Occupational Therapy Treatment  Patient Details  Name: Erica Cooper MRN: [id number] Date of Birth: [birth year] Referring Provider (OT): Dr. Jeanie Cooks   Encounter Date: 08/06/2020   OT End of Session - 08/06/20 1346    Visit Number 1    Number of Visits 9    Date for OT Re-Evaluation 10/29/20    Authorization Type [employer]    Authorization Time Period POC 1x week x 8 weeks plus eval , cert written for 12 weeks as pt does not start tx for 4 weeks    OT Start Time 0937    OT Stop Time 1008    OT Time Calculation (min) 31 min           Past Medical History:  Diagnosis Date  . Diabetes ([place])    borderline/ no meds  . GERD (gastroesophageal reflux disease)   . GI bleeding 09/2014   after colon polypectomy  . Hx of adenomatous colonic polyps 10/07/2014  . Hyperlipidemia   . Hypertension   . Obesity   . Stroke [facility]) 2007   weakness left side    Past Surgical History:  Procedure Laterality Date  . COLONOSCOPY    . COLONOSCOPY N/A 10/08/2014   Procedure: COLONOSCOPY;  Surgeon: Gatha Mayer, MD;  Location: [place];  Service: Endoscopy;  Laterality: N/A;    There were no vitals filed for this visit.   Subjective Assessment - 08/06/20 0938    Subjective  Pt reports she wants to get her hand working    Pertinent History CVA in 2007    Patient Stated Goals I want to get my hand work    Currently in Pain? No/denies              [facility] OT Assessment - 08/06/20 0940      Assessment   Medical Diagnosis CVA    Referring Provider (OT) Dr. Jeanie Cooks    Onset Date/Surgical Date 07/30/20    Hand Dominance Right    Prior Therapy in years past she had a little      Precautions   Precautions Fall      Balance Screen   Has the patient fallen in the past 6 months No    Has the patient had a decrease in  activity level because of a fear of falling?  No    Is the patient reluctant to leave their home because of a fear of falling?  No      Home  Environment   Family/patient expects to be discharged to: Private residence    [employer] Tub/Shower unit    Lives With Spouse;Family;Son      Prior Function   Level of Independence Independent with household mobility with device;Needs assistance with ADLs    Vocation Retired    Leisure cook      ADL   Eating/Feeding Needs assist with cutting food    Grooming Modified independent    Upper Body Bathing Modified independent   has shower chair   Lower Body Bathing Modified independent    Upper Body Dressing Minimal assistance    Lower Body Dressing Minimal assistance    Toilet Transfer Modified independent    Tub/Shower Transfer Minimal assistance      IADL   Shopping --   does not shop due to [employer] Needs help with  all home maintenance tasks    Meal Prep --   cooks but pt reports she needs help   Medication Management Is responsible for taking medication in correct dosages at correct time    Financial Management Dependent   son assists     Mobility   Mobility Status --   modified I     Written Expression   Dominant Hand Right      Cognition   Overall Cognitive Status Within Functional Limits for tasks assessed      Sensation   Light Touch Appears Intact      Coordination   Gross Motor Movements are Fluid and Coordinated No    Fine Motor Movements are Fluid and Coordinated No    Box and Blocks RUE 40 blocks, LUE 3 blocks      Tone   Assessment Location Left Upper Extremity      ROM / Strength   AROM / PROM / Strength AROM      AROM   Overall AROM  Deficits    Overall AROM Comments RUE WFLS, LUE shoulder flexion 75(mod compensations), elbow flexion/ extension 120/ 55 supination/ pronation 75%/75%, wrist flexion/ extension 10%/25%, finger flexion 30%      Hand Function   Right Hand Grip (lbs)  45.4    Left Hand Grip (lbs) 9      LUE Tone   LUE Tone Moderate                              OT Short Term Goals - 08/06/20 1344      OT SHORT TERM GOAL #1   Title I with HEP    Baseline dependent    Time 4    Period Weeks    Status New    Target Date 10/01/20      OT SHORT TERM GOAL #2   Title Pt will be modified I with donning shirt, pants,    Baseline min A    Time 4    Period Weeks    Status New      OT SHORT TERM GOAL #3   Title Pt will perform basic home management modified I    Baseline requires assistance    Time 4    Period Weeks    Status New      OT SHORT TERM GOAL #4   Title Pt will perform simple cooking modified I    Baseline requires assistance    Time 4    Period Weeks    Status New      OT SHORT TERM GOAL #5   Title Pt will use LUE consistently for ADLs/IADLS as a non dominant assist at least 25% of the time    Baseline does not use LUE consistently    Time 4    Period Weeks    Status New             OT Long Term Goals - 08/06/20 1350      OT LONG TERM GOAL #1   Title Independent w/ updated  HEP for LUE    Baseline dependent    Time 8    Period Weeks    Status New    Target Date 10/29/20      OT LONG TERM GOAL #2   Title Pt will verbalize understanding of adaped strategies/ AE to increase safety and I with ADLS/IADLS (such as cutting food)  Baseline dependent    Time 8    Period Weeks    Status New      OT LONG TERM GOAL #3   Title Independent with splint wear and care L hand for improved positioning prn    Baseline dependent    Time 8    Period Weeks    Status New      OT LONG TERM GOAL #4   Title Improve LUE function as evidenced by performing 6 blocks or more on Box & Blocks test    Baseline 3 blocks for LUE    Time 8    Period Weeks    Status New                 Plan - 08/06/20 1340    Clinical Impression Statement Pt is 70 y/o female with 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 Pt presents with the following deficits:decreased LUE functional use, L hemiplegia, spasticity, decreased coordination, decreased balance, decreased functional mobility which impedes perfromacne of ADLs/ IADLS. Pt can benefit from skilled occupational therapy to address these deficits in order to maximize pt's safety and I with daily activities. PMH:DM, stroke 2007, HTN, OA    OT Occupational Profile and History Problem Focused Assessment - Including review of records relating to presenting problem    Occupational performance deficits (Please refer to evaluation for details): ADL's;IADL's;Leisure;Social Participation    Body Structure / Function / Physical Skills ADL;UE functional use;Balance;Flexibility;FMC;ROM;GMC;Coordination;Decreased knowledge of precautions;IADL;Decreased knowledge of use of DME;Dexterity;Strength;Tone;Mobility    Rehab Potential Good    Clinical Decision Making Limited treatment options, no task modification necessary    Comorbidities Affecting Occupational Performance: May have comorbidities impacting occupational performance    Modification or Assistance to Complete Evaluation  No modification of tasks or assist necessary to complete eval    OT Frequency 1x / week   plus eval   OT Duration 8 weeks    OT Treatment/Interventions Self-care/ADL training;Visual/perceptual remediation/compensation;Patient/family education;Balance training;Cognitive remediation/compensation;Neuromuscular education;Therapeutic activities;Manual Therapy;Therapeutic exercise;Moist Heat;Splinting;Functional Mobility Training;Electrical Stimulation;Stair Training;Fluidtherapy;Cryotherapy;Passive range of motion;Paraffin;Aquatic Therapy;DME and/or AE instruction;Ultrasound    Plan initiate HEP for LUE    Consulted and Agree with Plan of Care Patient;Family member/caregiver    Family Member Consulted husband           Patient will benefit from skilled  therapeutic intervention in order to improve the following deficits and impairments:   Body Structure / Function / Physical Skills: ADL,UE functional use,Balance,Flexibility,FMC,ROM,GMC,Coordination,Decreased knowledge of precautions,IADL,Decreased knowledge of use of DME,Dexterity,Strength,Tone,Mobility       Visit Diagnosis: Hemiplegia and hemiparesis following cerebral infarction affecting left non-dominant side ([place]) - Plan: Ot plan of care cert/re-cert  Muscle weakness (generalized) - Plan: Ot plan of care cert/re-cert  Other lack of coordination - Plan: Ot plan of care cert/re-cert  Other abnormalities of gait and mobility - Plan: Ot plan of care cert/re-cert    Problem List Patient Active Problem List   Diagnosis Date Noted  . Hx of adenomatous colonic polyps 10/07/2014  . Dyspnea 08/27/2013  . Abnormal ECG 08/27/2013  . Stroke ([place])   . Hypertension   . Diabetes ([place])   . Obesity   . Hyperlipidemia     Pura Picinich 08/06/2020, 5:05 PM Theone Murdoch, OTR/L [phone] Phone: [phone] 5:05 PM 08/06/20 [place] [address] [place], [place], [address] Phone: [phone]   Fax:  [phone]  Name: Erica Cooper MRN:  [id number] Date of Birth: [birth year]

## 2020-08-13 ENCOUNTER — Other Ambulatory Visit: Payer: Self-pay

## 2020-08-13 ENCOUNTER — Ambulatory Visit: Payer: Medicaid Other

## 2020-08-13 DIAGNOSIS — R2689 Other abnormalities of gait and mobility: Secondary | ICD-10-CM

## 2020-08-13 DIAGNOSIS — M6281 Muscle weakness (generalized): Secondary | ICD-10-CM

## 2020-08-13 DIAGNOSIS — I69354 Hemiplegia and hemiparesis following cerebral infarction affecting left non-dominant side: Secondary | ICD-10-CM

## 2020-08-13 NOTE — Therapy (Signed)
[facility] [address] [place], [place], [address] Phone: [phone]   Fax:  [phone]  Physical Therapy Treatment  Patient Details  Name: Erica Cooper MRN: [id number] Date of Birth: 03-02-1951 Referring Provider (PT): Nolene Ebbs   Encounter Date: 08/13/2020   PT End of Session - 08/13/20 0845    Visit Number 3    Number of Visits 9    Authorization Type [employer] medicaid 3/17-5/16 12 visits    Authorization - Visit Number 1    Authorization - Number of Visits 12    PT Start Time 5956    PT Stop Time 0930    PT Time Calculation (min) 46 min    Equipment Utilized During Treatment Gait belt    Activity Tolerance Patient tolerated treatment well           Past Medical History:  Diagnosis Date  . Diabetes ([place])    borderline/ no meds  . GERD (gastroesophageal reflux disease)   . GI bleeding 09/2014   after colon polypectomy  . Hx of adenomatous colonic polyps 10/07/2014  . Hyperlipidemia   . Hypertension   . Obesity   . Stroke [facility]) 2007   weakness left side    Past Surgical History:  Procedure Laterality Date  . COLONOSCOPY    . COLONOSCOPY N/A 10/08/2014   Procedure: COLONOSCOPY;  Surgeon: Gatha Mayer, MD;  Location: [facility];  Service: Endoscopy;  Laterality: N/A;    There were no vitals filed for this visit.   Subjective Assessment - 08/13/20 0845    Subjective Pt reports that she has been working on the exercises. Still a little pain around right eye from surgery.    Patient is accompained by: Family member   husband and son   Patient Stated Goals Pt would like to get stronger to walk better    Currently in Pain? No/denies                             [facility] Adult PT Treatment/Exercise - 08/13/20 0846      Transfers   Transfers Sit to Stand;Stand to Sit    Sit to Stand 5: Supervision    Stand to Sit 5: Supervision      Ambulation/Gait    Ambulation/Gait Yes    Ambulation/Gait Assistance 5: Supervision;4: Min guard    Ambulation/Gait Assistance Details First bout PT provided tactile cues at pelvis to try to get more left anterior rotation to increase hip flexion and prevent hip hike. 2nd bout trialed left posterior Thuasne  AFO and pt with improved left foot clearance and faster gait speed. Pt liked the AFO and felt like she could move better.    Ambulation Distance (Feet) 230 Feet   115 with left posterior thuasne AFO and clinic shoes with toe caps   Assistive device None    Gait Pattern --   left posterior AFO during second bout   Ambulation Surface Level;Indoor      Standardized Balance Assessment   Standardized Balance Assessment Berg Balance Test      Berg Balance Test   Sit to Stand Able to stand without using hands and stabilize independently    Standing Unsupported Able to stand safely 2 minutes    Sitting with Back Unsupported but Feet Supported on Floor or Stool Able to sit safely and securely 2 minutes    Stand to Sit Sits safely with minimal  use of hands    Transfers Able to transfer safely, minor use of hands    Standing Unsupported with Eyes Closed Able to stand 10 seconds safely    Standing Ubsupported with Feet Together Able to place feet together independently and stand 1 minute safely    From Standing, Reach Forward with Outstretched Arm Can reach confidently >25 cm (10")    From Standing Position, Pick up Object from Floor Able to pick up shoe safely and easily    From Standing Position, Turn to Look Behind Over each Shoulder Turn sideways only but maintains balance    Turn 360 Degrees Able to turn 360 degrees safely but slowly    Standing Unsupported, Alternately Place Feet on Step/Stool Able to complete 4 steps without aid or supervision    Standing Unsupported, One Foot in Front Able to plae foot ahead of the other independently and hold 30 seconds    Standing on One Leg Tries to lift leg/unable to hold  3 seconds but remains standing independently    Total Score 46      Neuro Re-ed    Neuro Re-ed Details  Tall kneeling on mat (mod assist to get in to position stepping up with RLE first) with red physioball in front: Maintaining tall kneeling x 30 sec, mini-squats in tall kneeling with hands on red physioball x 10, bumping left knee in to therapist's hip to increase left weight shift with verbal cues for erect posture, raising RLE overhead x 10, stepping RLE forward and back to shift weight on LLE x 10 with CGA/min assist.      Exercises   Exercises Other Exercises    Other Exercises  Supine bridges x 10 with PT stabilizing left foot to prevent sliding and verbal cues to keep pelvis level, left knee to chest with left on red ball 10 x 2 with PTstabilizing to keep pelvis in neutral.                    PT Short Term Goals - 08/13/20 1324      PT SHORT TERM GOAL #1   Title Pt will be independent with initial HEP for strengthening, flexibility and balance.    Baseline no current HEP    Time 4    Period Weeks    Status New    Target Date 08/29/20      PT SHORT TERM GOAL #2   Title Pt will decrease TUG form 19.07 sec to <16 sec for improved balance and functional mobility.    Baseline 07/29/20 19.07 sec    Time 4    Period Weeks    Status New    Target Date 08/29/20      PT SHORT TERM GOAL #3   Title Pt will ambulate >300' on level surfaces without AD independently for improved household and short community distances.    Baseline 07/29/20 100' CGA with decreased left foot clearance.    Time 4    Period Weeks    Status New    Target Date 08/29/20      PT SHORT TERM GOAL #4   Title Berg Balance will be assessed to further assess balance.    Baseline Berg performed 08/13/20 46/56    Time 4    Period Weeks    Status New    Target Date 08/29/20             PT Long Term Goals - 08/13/20 1323  PT LONG TERM GOAL #1   Title Pt will be independent with progressive HEP  for strength, flexibility and balance to continue gains on own.    Baseline no current HEP    Time 8    Period Weeks    Status New    Target Date 09/28/20      PT LONG TERM GOAL #2   Title Pt will ambulate >600' on varied surfaces with LRAD versus no device mod I for improved community mobility.    Baseline 100' level surfaces CGA    Time 8    Period Weeks    Status New    Target Date 09/28/20      PT LONG TERM GOAL #3   Title Pt will increase gait speed from 0.80m/s to >0.12m/s for improved mobility.    Baseline 07/29/20 0.39m/s without AD    Time 8    Period Weeks    Status New    Target Date 09/28/20      PT LONG TERM GOAL #4   Title Pt will decrease 5 x sit to stand from 30.79 sec to <22 sec for improved balance and functional strength.    Baseline 07/29/20 30.79 sec from chair without hands    Time 8    Period Weeks    Status New    Target Date 09/28/20      PT LONG TERM GOAL #5   Title Pt will increase Berg from 46/56 to >49/56 for improved balance.    Baseline 08/13/20 46/56    Time 8    Period Weeks    Status New    Target Date 09/28/20                 Plan - 08/13/20 1315    Clinical Impression Statement PT continued to focus on left hip strengthening including NMR in tall kneeling to facilitate more left weight shift as well. Trialed left AFO today and pt did really well. Used posterior Thuasne AFO but would like to try the Ottobock (could not find today). Really only needs for foot clearance so may also try foot up brace to see if that does enough or not. Berg Balance was 46/56.    Personal Factors and Comorbidities Comorbidity 3+;Time since onset of injury/illness/exacerbation    Comorbidities DM, stroke 2007, HTN, OA    Examination-Activity Limitations Stairs;Locomotion Level;Stand;Transfers;Dressing    Examination-Participation Restrictions Community Activity;Cleaning;Laundry    Stability/Clinical Decision Making Evolving/Moderate complexity    Rehab  Potential Good    PT Frequency 1x / week   plus eval   PT Duration 8 weeks    PT Treatment/Interventions ADLs/Self Care Home Management;DME Instruction;Therapeutic activities;Functional mobility training;Gait training;Stair training;Therapeutic exercise;Balance training;Neuromuscular re-education;Orthotic Fit/Training;Patient/family education;Manual techniques;Passive range of motion;Vestibular    PT Next Visit Plan Trialed left AFO today and pt did really well. Used posterior Thuasne AFO but would like to try the Ottobock (could not find today). Really only needs for foot clearance so may also try foot up brace to see if that does enough or not. Letta Moynahan if you could please try both and let me know what you think. If the AFO  works best then will request an order. Needs more hip/knee flexion to help clear foot.Continue NMR for left weight shift and hip strengthening. Did well in tall kneeling once in position today. Can we add anything else to HEP?    Consulted and Agree with Plan of Care Patient;Family member/caregiver    Family Member Consulted  husband           Patient will benefit from skilled therapeutic intervention in order to improve the following deficits and impairments:  Abnormal gait,Decreased activity tolerance,Decreased balance,Decreased endurance,Decreased knowledge of use of DME,Decreased strength,Impaired tone,Decreased mobility,Decreased range of motion,Impaired flexibility  Visit Diagnosis: Other abnormalities of gait and mobility  Muscle weakness (generalized)  Hemiplegia and hemiparesis following cerebral infarction affecting left non-dominant side [facility])     Problem List Patient Active Problem List   Diagnosis Date Noted  . Hx of adenomatous colonic polyps 10/07/2014  . Dyspnea 08/27/2013  . Abnormal ECG 08/27/2013  . Stroke ([place])   . Hypertension   . Diabetes ([place])   . Obesity   . Hyperlipidemia     Electa Sniff, PT, DPT, NCS 08/13/2020, 1:25 PM  [facility] [address] [place], [place], [address] Phone: [phone]   Fax:  [phone]  Name: Erica Cooper MRN: [id number] Date of Birth: Nov 24, 1950

## 2020-08-20 ENCOUNTER — Ambulatory Visit: Payer: Medicaid Other | Admitting: Physical Therapy

## 2020-08-27 ENCOUNTER — Ambulatory Visit: Payer: Medicaid Other

## 2020-08-27 ENCOUNTER — Other Ambulatory Visit: Payer: Self-pay

## 2020-08-27 ENCOUNTER — Encounter: Payer: Self-pay | Admitting: Occupational Therapy

## 2020-08-27 ENCOUNTER — Ambulatory Visit: Payer: Medicaid Other | Admitting: Occupational Therapy

## 2020-08-27 DIAGNOSIS — M6281 Muscle weakness (generalized): Secondary | ICD-10-CM

## 2020-08-27 DIAGNOSIS — R2689 Other abnormalities of gait and mobility: Secondary | ICD-10-CM

## 2020-08-27 DIAGNOSIS — R278 Other lack of coordination: Secondary | ICD-10-CM

## 2020-08-27 DIAGNOSIS — I69354 Hemiplegia and hemiparesis following cerebral infarction affecting left non-dominant side: Secondary | ICD-10-CM

## 2020-08-27 NOTE — Therapy (Signed)
[facility] [address] [place], [place], [address] Phone: [phone]   Fax:  [phone]  Physical Therapy Treatment  Patient Details  Name: Erica Cooper MRN: [id number] Date of Birth: 08-29-1950 Referring Provider (PT): Nolene Ebbs   Encounter Date: 08/27/2020   PT End of Session - 08/27/20 0858    Visit Number 4    Number of Visits 9    Authorization Type [employer] medicaid 3/17-5/16 12 visits    Authorization - Visit Number 2    Authorization - Number of Visits 12    PT Start Time [phone]   pt arrived a little late and had to use restroom   PT Stop Time 0933    PT Time Calculation (min) 38 min    Equipment Utilized During Treatment Gait belt    Activity Tolerance Patient tolerated treatment well           Past Medical History:  Diagnosis Date  . Diabetes ([place])    borderline/ no meds  . GERD (gastroesophageal reflux disease)   . GI bleeding 09/2014   after colon polypectomy  . Hx of adenomatous colonic polyps 10/07/2014  . Hyperlipidemia   . Hypertension   . Obesity   . Stroke [facility]) 2007   weakness left side    Past Surgical History:  Procedure Laterality Date  . COLONOSCOPY    . COLONOSCOPY N/A 10/08/2014   Procedure: COLONOSCOPY;  Surgeon: Gatha Mayer, MD;  Location: [facility];  Service: Endoscopy;  Laterality: N/A;    There were no vitals filed for this visit.   Subjective Assessment - 08/27/20 0858    Subjective Pt reports that her ride never came last week which is why she missed her therapy. She developed LBP last Thursday. Started on left now moved to the right. Went to doctor and they prescribed tylenol 500mg .    Patient is accompained by: Family member   husband and son   Patient Stated Goals Pt would like to get stronger to walk better    Currently in Pain? Yes    Pain Score 7     Pain Location Back    Pain Orientation Lower;Right    Pain Descriptors / Indicators  Aching;Sore   "just pain"   Pain Onset In the past 7 days    Pain Frequency Intermittent    Aggravating Factors  changing positions like getting up from bed                             [facility] Adult PT Treatment/Exercise - 08/27/20 0932      Bed Mobility   Bed Mobility Rolling Left;Left Sidelying to Sit;Right Sidelying to Sit;Sit to Supine;Sit to Sidelying Right    Rolling Left Independent with assistive device   used mat to help pull over. Pain in low back.   Right Sidelying to Sit Contact Guard/Touching assist    Left Sidelying to Sit Minimal Assistance - Patient >75%   increased pain in right low back needing assist to rise.   Sit to Supine Supervision/Verbal cueing   pt had increased pain   Sit to Sidelying Right Supervision/Verbal cueing      Transfers   Transfers Sit to Stand;Stand to Sit    Sit to Stand 5: Supervision    Stand to Sit 5: Supervision    Comments Pt was instructed in log roll technique to prevent stress to back.  Ambulation/Gait   Ambulation/Gait Yes    Ambulation/Gait Assistance 5: Supervision;4: Min guard    Ambulation/Gait Assistance Details Ambulated with left posterior ottobock AFO at end of session with clinic shoes with toe cap. Pt able to clear left foot easier but still lacking hip flexion so decreased clearance. Less right lateral trunk lean. Verbal cues to try to increase left hip flexion. Pt reported back felt slightly better at end of session.    Ambulation Distance (Feet) 200 Feet    Assistive device None   left AFO   Gait Pattern Step-through pattern;Decreased step length - left;Decreased hip/knee flexion - left;Poor foot clearance - left    Ambulation Surface Level;Indoor      Therapeutic Activites    Therapeutic Activities Other Therapeutic Activities    Other Therapeutic Activities Assessed back with pt WFL with all lumbar motions and decreased some with lateral flexion to left with pt having pain in all directions in right  low back. No pain with passive flexion in supine or with A/P hip glides. No pain with SI joint gapping or compression. Pt tender to palpation L4-5 and on right at PSIS. Pt tight in lumbar paraspinals on right. PT performed passive lumbar rotation in hooklying x 5 each side. Some pain with going to the right. Discussed working on gentle rotation as well as using heat to low back x 20 minutes at a time to try to help relax muscles. Also educated on log roll technique and advised she come down to right as was less painful going down and up from here.                  PT Education - 08/27/20 1801    Education Details Educated on hooklying trunk rotation for gentle lumbar motion as well as heat to low back x 20 min.    Person(s) Educated Patient;Spouse    Methods Explanation;Demonstration    Comprehension Verbalized understanding            PT Short Term Goals - 08/13/20 1324      PT SHORT TERM GOAL #1   Title Pt will be independent with initial HEP for strengthening, flexibility and balance.    Baseline no current HEP    Time 4    Period Weeks    Status New    Target Date 08/29/20      PT SHORT TERM GOAL #2   Title Pt will decrease TUG form 19.07 sec to <16 sec for improved balance and functional mobility.    Baseline 07/29/20 19.07 sec    Time 4    Period Weeks    Status New    Target Date 08/29/20      PT SHORT TERM GOAL #3   Title Pt will ambulate >300' on level surfaces without AD independently for improved household and short community distances.    Baseline 07/29/20 100' CGA with decreased left foot clearance.    Time 4    Period Weeks    Status New    Target Date 08/29/20      PT SHORT TERM GOAL #4   Title Berg Balance will be assessed to further assess balance.    Baseline Berg performed 08/13/20 46/56    Time 4    Period Weeks    Status New    Target Date 08/29/20             PT Long Term Goals - 08/13/20 1323      PT  LONG TERM GOAL #1   Title Pt will  be independent with progressive HEP for strength, flexibility and balance to continue gains on own.    Baseline no current HEP    Time 8    Period Weeks    Status New    Target Date 09/28/20      PT LONG TERM GOAL #2   Title Pt will ambulate >600' on varied surfaces with LRAD versus no device mod I for improved community mobility.    Baseline 100' level surfaces CGA    Time 8    Period Weeks    Status New    Target Date 09/28/20      PT LONG TERM GOAL #3   Title Pt will increase gait speed from 0.44m/s to >0.26m/s for improved mobility.    Baseline 07/29/20 0.36m/s without AD    Time 8    Period Weeks    Status New    Target Date 09/28/20      PT LONG TERM GOAL #4   Title Pt will decrease 5 x sit to stand from 30.79 sec to <22 sec for improved balance and functional strength.    Baseline 07/29/20 30.79 sec from chair without hands    Time 8    Period Weeks    Status New    Target Date 09/28/20      PT LONG TERM GOAL #5   Title Pt will increase Berg from 46/56 to >49/56 for improved balance.    Baseline 08/13/20 46/56    Time 8    Period Weeks    Status New    Target Date 09/28/20                 Plan - 08/27/20 1802    Clinical Impression Statement Pt having right sided LBP today which limited treatment. Pt painful with all movements and very tight in right paraspinals. Was able to trial left posterior ottobock AFO which did well.    Personal Factors and Comorbidities Comorbidity 3+;Time since onset of injury/illness/exacerbation    Comorbidities DM, stroke 2007, HTN, OA    Examination-Activity Limitations Stairs;Locomotion Level;Stand;Transfers;Dressing    Examination-Participation Restrictions Community Activity;Cleaning;Laundry    Stability/Clinical Decision Making Evolving/Moderate complexity    Rehab Potential Good    PT Frequency 1x / week   plus eval   PT Duration 8 weeks    PT Treatment/Interventions ADLs/Self Care Home Management;DME  Instruction;Therapeutic activities;Functional mobility training;Gait training;Stair training;Therapeutic exercise;Balance training;Neuromuscular re-education;Orthotic Fit/Training;Patient/family education;Manual techniques;Passive range of motion;Vestibular    PT Next Visit Plan Check STGs. How is back doing? Trialed left posterior ottobock AFO today and pt did well. Really only needs for foot clearance so may also try foot up brace to see if that does enough or not.  If the AFO  works best then will request an order. Needs more hip/knee flexion to help clear foot.Continue NMR for left weight shift and hip strengthening. Did well in tall kneeling once in position today. Can we add anything else to HEP?    Consulted and Agree with Plan of Care Patient;Family member/caregiver    Family Member Consulted husband           Patient will benefit from skilled therapeutic intervention in order to improve the following deficits and impairments:  Abnormal gait,Decreased activity tolerance,Decreased balance,Decreased endurance,Decreased knowledge of use of DME,Decreased strength,Impaired tone,Decreased mobility,Decreased range of motion,Impaired flexibility  Visit Diagnosis: Other abnormalities of gait and mobility  Muscle weakness (generalized)  Problem List Patient Active Problem List   Diagnosis Date Noted  . Hx of adenomatous colonic polyps 10/07/2014  . Dyspnea 08/27/2013  . Abnormal ECG 08/27/2013  . Stroke ([place])   . Hypertension   . Diabetes ([place])   . Obesity   . Hyperlipidemia     Electa Sniff, PT, DPT, NCS 08/27/2020, 6:06 PM  [facility] [address] [place], [place], [address] Phone: [phone]   Fax:  [phone]  Name: Erica Cooper MRN: [id number] Date of Birth: 10/02/1950

## 2020-08-27 NOTE — Therapy (Signed)
[facility] [address] [place], [place], [address] Phone: [phone]   Fax:  [phone]  Occupational Therapy Treatment  Patient Details  Name: Erica Cooper MRN: [id number] Date of Birth: 08-31-1950 Referring Provider (OT): Dr. Jeanie Cooks   Encounter Date: 08/27/2020   OT End of Session - 08/27/20 1009    Visit Number 2    Number of Visits 9    Date for OT Re-Evaluation 10/29/20    Authorization Type [employer]    Authorization Time Period 8 visits through 10/16/20    Authorization - Visit Number 1    Authorization - Number of Visits 8    OT Start Time 0933    OT Stop Time 1013    OT Time Calculation (min) 40 min           Past Medical History:  Diagnosis Date  . Diabetes ([place])    borderline/ no meds  . GERD (gastroesophageal reflux disease)   . GI bleeding 09/2014   after colon polypectomy  . Hx of adenomatous colonic polyps 10/07/2014  . Hyperlipidemia   . Hypertension   . Obesity   . Stroke [facility]) 2007   weakness left side    Past Surgical History:  Procedure Laterality Date  . COLONOSCOPY    . COLONOSCOPY N/A 10/08/2014   Procedure: COLONOSCOPY;  Surgeon: Gatha Mayer, MD;  Location: [facility];  Service: Endoscopy;  Laterality: N/A;    There were no vitals filed for this visit.   Subjective Assessment - 08/27/20 0937    Subjective  Pt reports significant back pain    Pertinent History CVA in 2007    Patient Stated Goals I want to get my hand work    Currently in Pain? Yes    Pain Score 7     Pain Location Back    Pain Orientation Right    Pain Descriptors / Indicators Aching    Pain Type Acute pain    Pain Onset 1 to 4 weeks ago    Pain Frequency Intermittent    Aggravating Factors  changing positions    Pain Relieving Factors medicine                  Treatment; supine closed chain shoulder flexion with pt clasping hands together , min facillitation/ v.c.   (Attempted to have pt grasp PVC pipe frame, however pt does not demonstrate adequate grasp in LUE.) Supine AA/ROM elbow flexion/ extension and supination/ pronation, min facilitation.  Seated at table, hotpack applied to back due to pt's significant pain x 20 mins. With reduction in overall pain at end of session. AA/ROM wrist flexion/ extension followed by NMES x 10 mins small muscle, atrophy preset, intensity 18 with therapist facilitationg wrist and finger extension during on cycle.No adverse reaction.                OT Short Term Goals - 08/06/20 1344      OT SHORT TERM GOAL #1   Title I with HEP    Baseline dependent    Time 4    Period Weeks    Status New    Target Date 10/01/20      OT SHORT TERM GOAL #2   Title Pt will be modified I with donning shirt, pants,    Baseline min A    Time 4    Period Weeks    Status New      OT SHORT TERM GOAL #  3   Title Pt will perform basic home management modified I    Baseline requires assistance    Time 4    Period Weeks    Status New      OT SHORT TERM GOAL #4   Title Pt will perform simple cooking modified I    Baseline requires assistance    Time 4    Period Weeks    Status New      OT SHORT TERM GOAL #5   Title Pt will use LUE consistently for ADLs/IADLS as a non dominant assist at least 25% of the time    Baseline does not use LUE consistently    Time 4    Period Weeks    Status New             OT Long Term Goals - 08/06/20 1350      OT LONG TERM GOAL #1   Title Independent w/ updated  HEP for LUE    Baseline ddependent    Time 8    Period Weeks    Status New    Target Date 10/29/20      OT LONG TERM GOAL #2   Title Pt will verbalize understanding of adaped strategies/ AE to increase safety and I with ADLS/IADLS (such as cutting food)    Baseline dependent    Time 8    Period Weeks    Status New      OT LONG TERM GOAL #3   Title Independent with splint wear and care L hand for improved  positioning prn    Baseline dependent    Time 8    Period Weeks    Status New      OT LONG TERM GOAL #4   Title Improve LUE function as evidenced by performing 6 blocks or more on Box & Blocks test    Baseline 3 blocks for LUE    Time 8    Period Weeks    Status New                 Plan - 08/27/20 1010    Clinical Impression Statement Pt is progressing towards goals. She reponds to NMES for finger and wirst extensors.    OT Occupational Profile and History Problem Focused Assessment - Including review of records relating to presenting problem    Occupational performance deficits (Please refer to evaluation for details): ADL's;IADL's;Leisure;Social Participation    Body Structure / Function / Physical Skills ADL;UE functional use;Balance;Flexibility;FMC;ROM;GMC;Coordination;Decreased knowledge of precautions;IADL;Decreased knowledge of use of DME;Dexterity;Strength;Tone;Mobility    Rehab Potential Good    Clinical Decision Making Limited treatment options, no task modification necessary    Comorbidities Affecting Occupational Performance: May have comorbidities impacting occupational performance    Modification or Assistance to Complete Evaluation  No modification of tasks or assist necessary to complete eval    OT Frequency 1x / week   plus eval   OT Duration 8 weeks    OT Treatment/Interventions Self-care/ADL training;Visual/perceptual remediation/compensation;Patient/family education;Balance training;Cognitive remediation/compensation;Neuromuscular education;Therapeutic activities;Manual Therapy;Therapeutic exercise;Moist Heat;Splinting;Functional Mobility Training;Electrical Stimulation;Stair Training;Fluidtherapy;Cryotherapy;Passive range of motion;Paraffin;Aquatic Therapy;DME and/or AE instruction;Ultrasound    Plan initiate HEP for LUE, monitor back pain    Consulted and Agree with Plan of Care Patient;Family member/caregiver    Family Member Consulted husband            Patient will benefit from skilled therapeutic intervention in order to improve the following deficits and impairments:   Body Structure / Function /  Physical Skills: ADL,UE functional use,Balance,Flexibility,FMC,ROM,GMC,Coordination,Decreased knowledge of precautions,IADL,Decreased knowledge of use of DME,Dexterity,Strength,Tone,Mobility       Visit Diagnosis: Muscle weakness (generalized)  Hemiplegia and hemiparesis following cerebral infarction affecting left non-dominant side ([place])  Other lack of coordination    Problem List Patient Active Problem List   Diagnosis Date Noted  . Hx of adenomatous colonic polyps 10/07/2014  . Dyspnea 08/27/2013  . Abnormal ECG 08/27/2013  . Stroke ([place])   . Hypertension   . Diabetes ([place])   . Obesity   . Hyperlipidemia     Erica Cooper 08/27/2020, 1:45 PM  [facility] [address] [place], [place], [address] Phone: [phone]   Fax:  [phone]  Name: Erica Cooper MRN: [id number] Date of Birth: 1951-04-17

## 2020-09-03 ENCOUNTER — Telehealth: Payer: Self-pay

## 2020-09-03 ENCOUNTER — Encounter: Payer: Self-pay | Admitting: Occupational Therapy

## 2020-09-03 ENCOUNTER — Other Ambulatory Visit: Payer: Self-pay

## 2020-09-03 ENCOUNTER — Ambulatory Visit: Payer: Medicaid Other | Attending: Internal Medicine

## 2020-09-03 ENCOUNTER — Ambulatory Visit: Payer: Medicaid Other | Admitting: Occupational Therapy

## 2020-09-03 DIAGNOSIS — R2689 Other abnormalities of gait and mobility: Secondary | ICD-10-CM | POA: Diagnosis present

## 2020-09-03 DIAGNOSIS — M6281 Muscle weakness (generalized): Secondary | ICD-10-CM | POA: Insufficient documentation

## 2020-09-03 DIAGNOSIS — I69354 Hemiplegia and hemiparesis following cerebral infarction affecting left non-dominant side: Secondary | ICD-10-CM

## 2020-09-03 DIAGNOSIS — R278 Other lack of coordination: Secondary | ICD-10-CM | POA: Insufficient documentation

## 2020-09-03 NOTE — Therapy (Signed)
[facility] [address] [place], [place], [address] Phone: [phone]   Fax:  [phone]  Occupational Therapy Treatment  Patient Details  Name: Erica Cooper MRN: [id number] Date of Birth: 27-Apr-1951 Referring Provider (OT): Dr. Jeanie Cooks   Encounter Date: 09/03/2020   OT End of Session - 09/03/20 1425    Visit Number 3    Number of Visits 9    Date for OT Re-Evaluation 10/29/20    Authorization Type [employer]    Authorization Time Period 8 visits through 10/16/20    Authorization - Visit Number 2    Authorization - Number of Visits 8    OT Start Time 2536    OT Stop Time 1015    OT Time Calculation (min) 41 min           Past Medical History:  Diagnosis Date  . Diabetes ([place])    borderline/ no meds  . GERD (gastroesophageal reflux disease)   . GI bleeding 09/2014   after colon polypectomy  . Hx of adenomatous colonic polyps 10/07/2014  . Hyperlipidemia   . Hypertension   . Obesity   . Stroke [facility]) 2007   weakness left side    Past Surgical History:  Procedure Laterality Date  . COLONOSCOPY    . COLONOSCOPY N/A 10/08/2014   Procedure: COLONOSCOPY;  Surgeon: Gatha Mayer, MD;  Location: [facility];  Service: Endoscopy;  Laterality: N/A;    There were no vitals filed for this visit.   Subjective Assessment - 09/03/20 1424    Subjective  Pt reports continued back pain    Pertinent History CVA in 2007    Patient Stated Goals I want to get my hand work    Currently in Pain? Yes    Pain Score 5     Pain Location Back    Pain Orientation Right    Pain Descriptors / Indicators Aching    Pain Type Acute pain    Pain Onset 1 to 4 weeks ago    Pain Frequency Intermittent    Aggravating Factors  changing positions    Pain Relieving Factors heat and moving                   Treatment: Seated closed chain shoulder flexion reaching towards floor with pool noodle, min v.c   Weightbearing through  tilted stool with LUE for AAROM shoulder flexion , min-mod facilitation. Seated at table, pt was issued oval 8 splint for thumb, and educated in wear and care, precautions. Pt demonstrates improved positioning with oval 8.  Flipping  Playing cards and removing dowel pegs from pegboard with mod facilitation/ v.c  NMES to wrist and finger extensors x 8 mins small muscle, atrophy preset, intensity 20. Pt practiced donning her jacket, with min A.            OT Education - 09/03/20 1426    Education Details oval 8 splint wear, care and precuaitons.    Person(s) Educated Patient    Methods Explanation;Demonstration;Verbal cues;Handout    Comprehension Verbalized understanding;Returned demonstration;Verbal cues required            OT Short Term Goals - 08/06/20 1344      OT SHORT TERM GOAL #1   Title I with HEP    Baseline dependent    Time 4    Period Weeks    Status New    Target Date 10/01/20      OT SHORT  TERM GOAL #2   Title Pt will be modified I with donning shirt, pants,    Baseline min A    Time 4    Period Weeks    Status New      OT SHORT TERM GOAL #3   Title Pt will perform basic home management modified I    Baseline requires assistance    Time 4    Period Weeks    Status New      OT SHORT TERM GOAL #4   Title Pt will perform simple cooking modified I    Baseline requires assistance    Time 4    Period Weeks    Status New      OT SHORT TERM GOAL #5   Title Pt will use LUE consistently for ADLs/IADLS as a non dominant assist at least 25% of the time    Baseline does not use LUE consistently    Time 4    Period Weeks    Status New             OT Long Term Goals - 08/06/20 1350      OT LONG TERM GOAL #1   Title Independent w/ updated  HEP for LUE    Baseline ddependent    Time 8    Period Weeks    Status New    Target Date 10/29/20      OT LONG TERM GOAL #2   Title Pt will verbalize understanding of adaped  strategies/ AE to increase safety and I with ADLS/IADLS (such as cutting food)    Baseline dependent    Time 8    Period Weeks    Status New      OT LONG TERM GOAL #3   Title Independent with splint wear and care L hand for improved positioning prn    Baseline dependent    Time 8    Period Weeks    Status New      OT LONG TERM GOAL #4   Title Improve LUE function as evidenced by performing 6 blocks or more on Box & Blocks test    Baseline 3 blocks for LUE    Time 8    Period Weeks    Status New                 Plan - 09/03/20 1426    Clinical Impression Statement Pt is progressing towards goals. She reponds to NMES for finger and wirst extensors.    OT Occupational Profile and History Problem Focused Assessment - Including review of records relating to presenting problem    Occupational performance deficits (Please refer to evaluation for details): ADL's;IADL's;Leisure;Social Participation    Body Structure / Function / Physical Skills ADL;UE functional use;Balance;Flexibility;FMC;ROM;GMC;Coordination;Decreased knowledge of precautions;IADL;Decreased knowledge of use of DME;Dexterity;Strength;Tone;Mobility    Rehab Potential Good    Clinical Decision Making Limited treatment options, no task modification necessary    Comorbidities Affecting Occupational Performance: May have comorbidities impacting occupational performance    Modification or Assistance to Complete Evaluation  No modification of tasks or assist necessary to complete eval    OT Frequency 1x / week   plus eval   OT Duration 8 weeks    OT Treatment/Interventions Self-care/ADL training;Visual/perceptual remediation/compensation;Patient/family education;Balance training;Cognitive remediation/compensation;Neuromuscular education;Therapeutic activities;Manual Therapy;Therapeutic exercise;Moist Heat;Splinting;Functional Mobility Training;Electrical Stimulation;Stair Training;Fluidtherapy;Cryotherapy;Passive range of  motion;Paraffin;Aquatic Therapy;DME and/or AE instruction;Ultrasound    Plan issue HEP for LUE, monitor back pain, practice donning shirt and pants  Consulted and Agree with Plan of Care Patient;Family member/caregiver    Family Member Consulted husband           Patient will benefit from skilled therapeutic intervention in order to improve the following deficits and impairments:   Body Structure / Function / Physical Skills: ADL,UE functional use,Balance,Flexibility,FMC,ROM,GMC,Coordination,Decreased knowledge of precautions,IADL,Decreased knowledge of use of DME,Dexterity,Strength,Tone,Mobility       Visit Diagnosis: Muscle weakness (generalized)  Hemiplegia and hemiparesis following cerebral infarction affecting left non-dominant side ([place])  Other lack of coordination  Other abnormalities of gait and mobility    Problem List Patient Active Problem List   Diagnosis Date Noted  . Hx of adenomatous colonic polyps 10/07/2014  . Dyspnea 08/27/2013  . Abnormal ECG 08/27/2013  . Stroke ([place])   . Hypertension   . Diabetes ([place])   . Obesity   . Hyperlipidemia     [facility] [place] 09/03/2020, 2:27 PM Theone Murdoch, OTR/L [phone] Phone: [phone] 2:33 PM 09/03/20 [facility] [address] [place], [place], [address] Phone: [phone]   Fax:  [phone]  Name: Nelly Scriven MRN: [id number] Date of Birth: July 08, 1950

## 2020-09-03 NOTE — Therapy (Signed)
[facility] [address] [place], [place], [address] Phone: [phone]   Fax:  [phone]  Physical Therapy Treatment  Patient Details  Name: Erica Cooper MRN: [id number] Date of Birth: [birth year] Referring Provider (PT): Nolene Ebbs   Encounter Date: 09/03/2020   PT End of Session - 09/03/20 0853    Visit Number 5    Number of Visits 9    Authorization Type [employer] medicaid 3/17-5/16 12 visits    Authorization - Visit Number 3    Authorization - Number of Visits 12    PT Start Time 0848    PT Stop Time 0932    PT Time Calculation (min) 44 min    Equipment Utilized During Treatment Gait belt    Activity Tolerance Patient tolerated treatment well           Past Medical History:  Diagnosis Date  . Diabetes ([place])    borderline/ no meds  . GERD (gastroesophageal reflux disease)   . GI bleeding 09/2014   after colon polypectomy  . Hx of adenomatous colonic polyps 10/07/2014  . Hyperlipidemia   . Hypertension   . Obesity   . Stroke [facility]) 2007   weakness left side    Past Surgical History:  Procedure Laterality Date  . COLONOSCOPY    . COLONOSCOPY N/A 10/08/2014   Procedure: COLONOSCOPY;  Surgeon: Gatha Mayer, MD;  Location: [facility];  Service: Endoscopy;  Laterality: N/A;    There were no vitals filed for this visit.   Subjective Assessment - 09/03/20 0854    Subjective Pt reports that she has not been taking anymore tylenol. She is trying to get up and move every 30 min. Using hot pack on back as well. Pain some better. Worse if sits too long or tries to get up from laying down.    Patient is accompained by: Family member   husband and son   Patient Stated Goals Pt would like to get stronger to walk better    Currently in Pain? Yes    Pain Score 5     Pain Location Back    Pain Orientation Right    Pain Descriptors / Indicators Aching    Pain Type Acute pain    Pain Onset In the  past 7 days    Pain Frequency Intermittent    Aggravating Factors  changing positions    Pain Relieving Factors heat and moving                             [facility] Adult PT Treatment/Exercise - 09/03/20 0918      Transfers   Transfers Sit to Stand;Stand to Sit    Sit to Stand 5: Supervision    Sit to Stand Details (indicate cue type and reason) increased time to rise due to back pain    Stand to Sit 5: Supervision      Ambulation/Gait   Ambulation/Gait Yes    Ambulation/Gait Assistance 5: Supervision    Ambulation/Gait Assistance Details Pt ambulated 115' with left foot up brace to trial. Pt's toes clear some better but still scufing floor some. Briefly trialed posterior [place] AFO but was painful with medial strut so switched to left posterior [place] AFO that hs lateral strut and pt reported improved comfort. Improved step length on left and foot clearance with AFO. Trialed SPC x 115' with AFO to see if that would decrease lateral trunk  lean to right some and slight improvement noted but pt was not consistent with her cane sequencing and would need continued practice with cane to see if this would help gait quality more. PT going to pursue left AFO order.    Ambulation Distance (Feet) 115 Feet   x 2 then 345' x 1 with left posterior [place] AFO   Assistive device None;Straight cane    Gait Pattern Step-through pattern;Decreased hip/knee flexion - left;Decreased dorsiflexion - left;Lateral trunk lean to right    Ambulation Surface Level;Indoor      Standardized Balance Assessment   Standardized Balance Assessment Timed Up and Go Test      Timed Up and Go Test   TUG Normal TUG    Normal TUG (seconds) 26   24.88 sec and 28.9 sec wearing left posterior [place] AFO                 PT Education - 09/03/20 1311    Education Details Pt was instructed to continue with heat to low back to help with pain. Discussed positioning in chair with lumbar/towel roll to  provide support and sitting up tall against back of chair versus slouching. Discussed PT will be requesting left AFO order from MD. Pt in agreement.    Person(s) Educated Patient    Methods Explanation;Demonstration    Comprehension Verbalized understanding            PT Short Term Goals - 09/03/20 1314      PT SHORT TERM GOAL #1   Title Pt will be independent with initial HEP for strengthening, flexibility and balance.    Baseline 09/03/20 pt reports working on current HEP and denies any questions.    Time 4    Period Weeks    Status Achieved    Target Date 08/29/20      PT SHORT TERM GOAL #2   Title Pt will decrease TUG form 19.07 sec to <16 sec for improved balance and functional mobility.    Baseline 07/29/20 19.07 sec. 09/03/20 26 sec    Time 4    Period Weeks    Status Not Met    Target Date 08/29/20      PT SHORT TERM GOAL #3   Title Pt will ambulate >300' on level surfaces without AD independently for improved household and short community distances.    Baseline 07/29/20 100' CGA with decreased left foot clearance. 09/03/20 345' supervision but more due to working on gait quality.    Time 4    Period Weeks    Status Partially Met    Target Date 08/29/20      PT SHORT TERM GOAL #4   Title Berg Balance will be assessed to further assess balance.    Baseline Berg performed 08/13/20 46/56    Time 4    Period Weeks    Status Achieved    Target Date 08/29/20             PT Long Term Goals - 08/13/20 1323      PT LONG TERM GOAL #1   Title Pt will be independent with progressive HEP for strength, flexibility and balance to continue gains on own.    Baseline no current HEP    Time 8    Period Weeks    Status New    Target Date 09/28/20      PT LONG TERM GOAL #2   Title Pt will ambulate >600' on varied surfaces with LRAD versus  no device mod I for improved community mobility.    Baseline 100' level surfaces CGA    Time 8    Period Weeks    Status New    Target Date  09/28/20      PT LONG TERM GOAL #3   Title Pt will increase gait speed from 0.4m/s to >0.55m/s for improved mobility.    Baseline 07/29/20 0.76m/s without AD    Time 8    Period Weeks    Status New    Target Date 09/28/20      PT LONG TERM GOAL #4   Title Pt will decrease 5 x sit to stand from 30.79 sec to <22 sec for improved balance and functional strength.    Baseline 07/29/20 30.79 sec from chair without hands    Time 8    Period Weeks    Status New    Target Date 09/28/20      PT LONG TERM GOAL #5   Title Pt will increase Berg from 46/56 to >49/56 for improved balance.    Baseline 08/13/20 46/56    Time 8    Period Weeks    Status New    Target Date 09/28/20                 Plan - 09/03/20 1315    Clinical Impression Statement Pt's back pain better than last week but still painful with transitions. Left posterior [place] AFO worked best and helped to improve left step length and foot clearance. Still lacking hip flexion. Pt was able to meet gait distance part of goal and only supervision more as working on improving technique and trialing AFOs. She reports doing well with exercises at home. Did not decrease TUG time today but most likely due to moving slower due to right LBP. Pt continues to benefit from skilled PT to continue to work on strengthening, balance and gait.    Personal Factors and Comorbidities Comorbidity 3+;Time since onset of injury/illness/exacerbation    Comorbidities DM, stroke 2007, HTN, OA    Examination-Activity Limitations Stairs;Locomotion Level;Stand;Transfers;Dressing    Examination-Participation Restrictions Community Activity;Cleaning;Laundry    Stability/Clinical Decision Making Evolving/Moderate complexity    Rehab Potential Good    PT Frequency 1x / week   plus eval   PT Duration 8 weeks    PT Treatment/Interventions ADLs/Self Care Home Management;DME Instruction;Therapeutic activities;Functional mobility training;Gait training;Stair  training;Therapeutic exercise;Balance training;Neuromuscular re-education;Orthotic Fit/Training;Patient/family education;Manual techniques;Passive range of motion;Vestibular    PT Next Visit Plan Did order for AFO come back? How is back doing? Left posterior [place] AFO worked best so utilize with gait. Try with and without cane to see if can help decrease right lateral trunk lean. Needs more hip/knee flexion to help clear foot.Continue NMR for left weight shift and hip strengthening. Did well in tall kneeling once in position today. Can we add anything else to HEP?    Consulted and Agree with Plan of Care Patient;Family member/caregiver    Family Member Consulted husband           Patient will benefit from skilled therapeutic intervention in order to improve the following deficits and impairments:  Abnormal gait,Decreased activity tolerance,Decreased balance,Decreased endurance,Decreased knowledge of use of DME,Decreased strength,Impaired tone,Decreased mobility,Decreased range of motion,Impaired flexibility  Visit Diagnosis: Other abnormalities of gait and mobility  Muscle weakness (generalized)     Problem List Patient Active Problem List   Diagnosis Date Noted  . Hx of adenomatous colonic polyps 10/07/2014  . Dyspnea 08/27/2013  .  Abnormal ECG 08/27/2013  . Stroke ([place])   . Hypertension   . Diabetes ([place])   . Obesity   . Hyperlipidemia     Electa Sniff, PT, DPT, NCS 09/03/2020, 1:20 PM  [facility] [address] [place], [place], [address] Phone: [phone]   Fax:  [phone]  Name: Erica Cooper MRN: [id number] Date of Birth: [birth year]

## 2020-09-03 NOTE — Patient Instructions (Signed)
Wear your thumb splint only during the day remove if any pain or problems.

## 2020-09-03 NOTE — Telephone Encounter (Signed)
Dr. 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  She will benefit from use of left AFO in order to improve safety with functional mobility.    If you agree, please submit request in EPIC under MD Order, Other Orders (list left AFO in comments) or fax to [facility] at [phone].   Thank you, Cherly Anderson, PT, DPT, [facility] [address] [place], [place]  [address] Phone:  [phone] Fax:  [phone]

## 2020-09-10 ENCOUNTER — Ambulatory Visit: Payer: Medicaid Other | Admitting: Occupational Therapy

## 2020-09-10 ENCOUNTER — Ambulatory Visit: Payer: Medicaid Other

## 2020-09-10 ENCOUNTER — Other Ambulatory Visit: Payer: Self-pay

## 2020-09-10 ENCOUNTER — Telehealth: Payer: Self-pay

## 2020-09-10 DIAGNOSIS — M6281 Muscle weakness (generalized): Secondary | ICD-10-CM

## 2020-09-10 DIAGNOSIS — R2689 Other abnormalities of gait and mobility: Secondary | ICD-10-CM

## 2020-09-10 DIAGNOSIS — I69354 Hemiplegia and hemiparesis following cerebral infarction affecting left non-dominant side: Secondary | ICD-10-CM

## 2020-09-10 DIAGNOSIS — R278 Other lack of coordination: Secondary | ICD-10-CM

## 2020-09-10 NOTE — Therapy (Signed)
[facility] [address] [place], [place], [address] Phone: [phone]   Fax:  [phone]  Physical Therapy Treatment  Patient Details  Name: Erica Cooper MRN: [id number] Date of Birth: [birth year] Referring Provider (PT): Nolene Ebbs   Encounter Date: 09/10/2020   PT End of Session - 09/10/20 0847    Visit Number 6    Number of Visits 9    Authorization Type [employer] medicaid 3/17-5/16 12 visits    Authorization - Visit Number 4    Authorization - Number of Visits 12    PT Start Time 0845    PT Stop Time 0933    PT Time Calculation (min) 48 min    Equipment Utilized During Treatment Gait belt    Activity Tolerance Patient tolerated treatment well           Past Medical History:  Diagnosis Date  . Diabetes ([place])    borderline/ no meds  . GERD (gastroesophageal reflux disease)   . GI bleeding 09/2014   after colon polypectomy  . Hx of adenomatous colonic polyps 10/07/2014  . Hyperlipidemia   . Hypertension   . Obesity   . Stroke [facility]) 2007   weakness left side    Past Surgical History:  Procedure Laterality Date  . COLONOSCOPY    . COLONOSCOPY N/A 10/08/2014   Procedure: COLONOSCOPY;  Surgeon: Gatha Mayer, MD;  Location: [facility];  Service: Endoscopy;  Laterality: N/A;    There were no vitals filed for this visit.   Subjective Assessment - 09/10/20 0849    Subjective Pt reports that back is doing some better but still has pain with sitting or going to get up.    Patient is accompained by: Family member   husband and son   Patient Stated Goals Pt would like to get stronger to walk better    Currently in Pain? Yes    Pain Score 3     Pain Location Back    Pain Orientation Right    Pain Descriptors / Indicators Aching    Pain Type Acute pain    Pain Onset 1 to 4 weeks ago    Pain Frequency Intermittent    Aggravating Factors  sitting, changing positions    Pain Relieving Factors  heat and moving                             [facility] Adult PT Treatment/Exercise - 09/10/20 0850      Transfers   Transfers Sit to Stand;Stand to Sit    Sit to Stand 6: Modified independent (Device/Increase time)    Stand to Sit 6: Modified independent (Device/Increase time)      Ambulation/Gait   Ambulation/Gait Yes    Ambulation/Gait Assistance 5: Supervision    Ambulation/Gait Assistance Details Pt ambulated with left posterior Thu[place]ne AFO with clinic shoe with toe cap. Pt was given verbal cues to try to bring left hip through more from hip and not lean to right with tactile cues at pelvis at times to help facilitate. Pt able to increase left foot clearance and had larger step length and reported feeling better with AFO.    Ambulation Distance (Feet) 230 Feet    Assistive device None    Gait Pattern Step-through pattern;Decreased hip/knee flexion - left;Decreased arm swing - left;Decreased stance time - left;Decreased step length - right;Decreased step length - left;Lateral trunk lean to right  Ambulation Surface Level;Indoor      Neuro Re-ed    Neuro Re-ed Details  Tall kneeling with bench in front: weight shifting side to side x 10, hip hinge x 10, stepping each knee forward and back 5 x 2 with cues to stay up tall through stabilizing leg, raising RUE up/down without touching x 10. In quadruped: rocking forward and back x 10 then crawling left knee forward and back x 5. In front of mirror: tapping 2" step with LLE x 10, weight shifting in staggered stance, Tapping 4" step x 4 with LLE with PT assisting mod assist to initiate knee flexion and preventing lateral trunk lean.      Exercises   Exercises Other Exercises    Other Exercises  seated left hamstring curls x 10 on foam roll and x 10 on towel.                  PT Education - 09/10/20 1433    Education Details Added hamstring curls and weight shifting in diagonals to HEP    Person(s) Educated Patient     Methods Explanation;Demonstration;Handout    Comprehension Verbalized understanding;Returned demonstration            PT Short Term Goals - 09/03/20 1314      PT SHORT TERM GOAL #1   Title Pt will be independent with initial HEP for strengthening, flexibility and balance.    Baseline 09/03/20 pt reports working on current HEP and denies any questions.    Time 4    Period Weeks    Status Achieved    Target Date 08/29/20      PT SHORT TERM GOAL #2   Title Pt will decrease TUG form 19.07 sec to <16 sec for improved balance and functional mobility.    Baseline 07/29/20 19.07 sec. 09/03/20 26 sec    Time 4    Period Weeks    Status Not Met    Target Date 08/29/20      PT SHORT TERM GOAL #3   Title Pt will ambulate >300' on level surfaces without AD independently for improved household and short community distances.    Baseline 07/29/20 100' CGA with decreased left foot clearance. 09/03/20 345' supervision but more due to working on gait quality.    Time 4    Period Weeks    Status Partially Met    Target Date 08/29/20      PT SHORT TERM GOAL #4   Title Berg Balance will be assessed to further assess balance.    Baseline Berg performed 08/13/20 46/56    Time 4    Period Weeks    Status Achieved    Target Date 08/29/20             PT Long Term Goals - 08/13/20 1323      PT LONG TERM GOAL #1   Title Pt will be independent with progressive HEP for strength, flexibility and balance to continue gains on own.    Baseline no current HEP    Time 8    Period Weeks    Status New    Target Date 09/28/20      PT LONG TERM GOAL #2   Title Pt will ambulate >600' on varied surfaces with LRAD versus no device mod I for improved community mobility.    Baseline 100' level surfaces CGA    Time 8    Period Weeks    Status New  Target Date 09/28/20      PT LONG TERM GOAL #3   Title Pt will increase gait speed from 0.74m/s to >0.67m/s for improved mobility.    Baseline 07/29/20 0.80m/s  without AD    Time 8    Period Weeks    Status New    Target Date 09/28/20      PT LONG TERM GOAL #4   Title Pt will decrease 5 x sit to stand from 30.79 sec to <22 sec for improved balance and functional strength.    Baseline 07/29/20 30.79 sec from chair without hands    Time 8    Period Weeks    Status New    Target Date 09/28/20      PT LONG TERM GOAL #5   Title Pt will increase Berg from 46/56 to >49/56 for improved balance.    Baseline 08/13/20 46/56    Time 8    Period Weeks    Status New    Target Date 09/28/20                 Plan - 09/10/20 1434    Clinical Impression Statement Pt's did well with activities today with no reports of increased back pain. Focused more on NMR trying to help break up extensor tone and increase left hip flexion. Pt with improved left foot clearance with use of left AFO. Waiting on MD order to get consult with orthotist scheduled.    Personal Factors and Comorbidities Comorbidity 3+;Time since onset of injury/illness/exacerbation    Comorbidities DM, stroke 2007, HTN, OA    Examination-Activity Limitations Stairs;Locomotion Level;Stand;Transfers;Dressing    Examination-Participation Restrictions Community Activity;Cleaning;Laundry    Stability/Clinical Decision Making Evolving/Moderate complexity    Rehab Potential Good    PT Frequency 1x / week   plus eval   PT Duration 8 weeks    PT Treatment/Interventions ADLs/Self Care Home Management;DME Instruction;Therapeutic activities;Functional mobility training;Gait training;Stair training;Therapeutic exercise;Balance training;Neuromuscular re-education;Orthotic Fit/Training;Patient/family education;Manual techniques;Passive range of motion;Vestibular    PT Next Visit Plan Did order for AFO come back? How is back doing? Left posterior Thuasne AFO worked best so utilize with gait. Try with and without cane to see if can help decrease right lateral trunk lean. Needs more hip/knee flexion to help  clear foot.Continue NMR for left weight shift and hip strengthening. Continue with tall kneeling activities.    Consulted and Agree with Plan of Care Patient;Family member/caregiver    Family Member Consulted husband           Patient will benefit from skilled therapeutic intervention in order to improve the following deficits and impairments:  Abnormal gait,Decreased activity tolerance,Decreased balance,Decreased endurance,Decreased knowledge of use of DME,Decreased strength,Impaired tone,Decreased mobility,Decreased range of motion,Impaired flexibility  Visit Diagnosis: Other abnormalities of gait and mobility  Muscle weakness (generalized)     Problem List Patient Active Problem List   Diagnosis Date Noted  . Hx of adenomatous colonic polyps 10/07/2014  . Dyspnea 08/27/2013  . Abnormal ECG 08/27/2013  . Stroke ([place])   . Hypertension   . Diabetes ([place])   . Obesity   . Hyperlipidemia     Electa Sniff, PT, DPT, NCS 09/10/2020, 2:36 PM  [facility] [address] [place], [place], [address] Phone: [phone]   Fax:  [phone]  Name: Yarelie Hams MRN: [id number] Date of Birth: Mar 16, 1951

## 2020-09-10 NOTE — Telephone Encounter (Signed)
Resending 2nd attempt:  Dr. 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  She will benefit from use of left AFO in order to improve safety with functional mobility.    If you agree, please submit request in EPIC under MD Order, Other Orders (list left AFO in comments) or fax to [facility] at [phone].   Thank you, Cherly Anderson, PT, DPT, [facility]  [address] [place], [place]  [address] Phone:  [phone] Fax:  [phone]

## 2020-09-10 NOTE — Therapy (Signed)
[facility] [address] [place], [place], [address] Phone: [phone]   Fax:  [phone]  Occupational Therapy Treatment  Patient Details  Name: Erica Cooper MRN: [id number] Date of Birth: [birth year] Referring Provider (OT): Dr. Jeanie Cooks   Encounter Date: 09/10/2020   OT End of Session - 09/10/20 1017    Visit Number 4    Number of Visits 9    Date for OT Re-Evaluation 10/29/20    Authorization Type [employer]    Authorization Time Period 8 visits through 10/16/20    Authorization - Visit Number 3    Authorization - Number of Visits 8    OT Start Time 6962    OT Stop Time 1056    OT Time Calculation (min) 40 min           Past Medical History:  Diagnosis Date  . Diabetes ([place])    borderline/ no meds  . GERD (gastroesophageal reflux disease)   . GI bleeding 09/2014   after colon polypectomy  . Hx of adenomatous colonic polyps 10/07/2014  . Hyperlipidemia   . Hypertension   . Obesity   . Stroke [facility]) 2007   weakness left side    Past Surgical History:  Procedure Laterality Date  . COLONOSCOPY    . COLONOSCOPY N/A 10/08/2014   Procedure: COLONOSCOPY;  Surgeon: Gatha Mayer, MD;  Location: [facility];  Service: Endoscopy;  Laterality: N/A;    There were no vitals filed for this visit.   Subjective Assessment - 09/10/20 1018    Subjective  Pt reports continued back pain    Pertinent History CVA in 2007    Patient Stated Goals I want to get my hand work    Currently in Pain? Yes    Pain Score 3     Pain Location Back    Pain Orientation Right    Pain Descriptors / Indicators Aching    Pain Type Acute pain    Pain Onset 1 to 4 weeks ago    Pain Frequency Intermittent    Aggravating Factors  sitting and changing positions    Pain Relieving Factors heat and moving             TREATMENT:  Supine Self PROM exercises x 10 shoulder press, chest press and horizontal abduction -  applied Moist Heat on lower back for back pain during supine exercises    UB/LB Dressing Pt demonstrated donning tshirt and shorts with mod I this day. Pt dons unaffected arm and leg first with dressing and was advised if tougher article of clothing it is recommended to don affected side first. Pt verbalized understanding  Weight bearing in LUE for inhibition of tone and facilitation of muscle activation while reaching with RUE to place resistance clothespins. Pt required min assistance for maintaining hand position on mat for weight bearing.                   OT Education - 09/10/20 1059    Education Details supine self PROM HEP - forgot to give printed instructions this session. issue written instructions next session.    Person(s) Educated Patient    Methods Explanation;Demonstration;Verbal cues    Comprehension Verbalized understanding;Returned demonstration;Verbal cues required            OT Short Term Goals - 09/10/20 1059      OT SHORT TERM GOAL #1   Title I with HEP    Baseline dependent  Time 4    Period Weeks    Status On-going    Target Date 10/01/20      OT SHORT TERM GOAL #2   Title Pt will be modified I with donning shirt, pants,    Baseline min A    Time 4    Period Weeks    Status New      OT SHORT TERM GOAL #3   Title Pt will perform basic home management modified I    Baseline requires assistance    Time 4    Period Weeks    Status New      OT SHORT TERM GOAL #4   Title Pt will perform simple cooking modified I    Baseline requires assistance    Time 4    Period Weeks    Status New      OT SHORT TERM GOAL #5   Title Pt will use LUE consistently for ADLs/IADLS as a non dominant assist at least 25% of the time    Baseline does not use LUE consistently    Time 4    Period Weeks    Status New             OT Long Term Goals - 08/06/20 1350      OT LONG TERM GOAL #1   Title Independent w/ updated  HEP for LUE    Baseline  ddependent    Time 8    Period Weeks    Status New    Target Date 10/29/20      OT LONG TERM GOAL #2   Title Pt will verbalize understanding of adaped strategies/ AE to increase safety and I with ADLS/IADLS (such as cutting food)    Baseline dependent    Time 8    Period Weeks    Status New      OT LONG TERM GOAL #3   Title Independent with splint wear and care L hand for improved positioning prn    Baseline dependent    Time 8    Period Weeks    Status New      OT LONG TERM GOAL #4   Title Improve LUE function as evidenced by performing 6 blocks or more on Box & Blocks test    Baseline 3 blocks for LUE    Time 8    Period Weeks    Status New                 Plan - 09/10/20 1058    Clinical Impression Statement Pt is progressing towards goals. Pt demonstrated and reports doing UB and LB dressing with mod I. Pt brought finger flexor device but was advised to hold off with use for now as it does not provide adequate stretch.    OT Occupational Profile and History Problem Focused Assessment - Including review of records relating to presenting problem    Occupational performance deficits (Please refer to evaluation for details): ADL's;IADL's;Leisure;Social Participation    Body Structure / Function / Physical Skills ADL;UE functional use;Balance;Flexibility;FMC;ROM;GMC;Coordination;Decreased knowledge of precautions;IADL;Decreased knowledge of use of DME;Dexterity;Strength;Tone;Mobility    Rehab Potential Good    Clinical Decision Making Limited treatment options, no task modification necessary    Comorbidities Affecting Occupational Performance: May have comorbidities impacting occupational performance    Modification or Assistance to Complete Evaluation  No modification of tasks or assist necessary to complete eval    OT Frequency 1x / week   plus eval  OT Duration 8 weeks    OT Treatment/Interventions Self-care/ADL training;Visual/perceptual  remediation/compensation;Patient/family education;Balance training;Cognitive remediation/compensation;Neuromuscular education;Therapeutic activities;Manual Therapy;Therapeutic exercise;Moist Heat;Splinting;Functional Mobility Training;Electrical Stimulation;Stair Training;Fluidtherapy;Cryotherapy;Passive range of motion;Paraffin;Aquatic Therapy;DME and/or AE instruction;Ultrasound    Plan NMES for finger flexors, monitor back pain    Consulted and Agree with Plan of Care Patient;Family member/caregiver    Family Member Consulted husband           Patient will benefit from skilled therapeutic intervention in order to improve the following deficits and impairments:   Body Structure / Function / Physical Skills: ADL,UE functional use,Balance,Flexibility,FMC,ROM,GMC,Coordination,Decreased knowledge of precautions,IADL,Decreased knowledge of use of DME,Dexterity,Strength,Tone,Mobility       Visit Diagnosis: Muscle weakness (generalized)  Hemiplegia and hemiparesis following cerebral infarction affecting left non-dominant side ([place])  Other lack of coordination  Other abnormalities of gait and mobility    Problem List Patient Active Problem List   Diagnosis Date Noted  . Hx of adenomatous colonic polyps 10/07/2014  . Dyspnea 08/27/2013  . Abnormal ECG 08/27/2013  . Stroke ([place])   . Hypertension   . Diabetes ([place])   . Obesity   . Hyperlipidemia     Zachery Conch 09/10/2020, 11:00 AM  [facility] [address] [place], [place], [address] Phone: [phone]   Fax:  [phone]  Name: Gratia Disla MRN: [id number] Date of Birth: 08-08-1950

## 2020-09-10 NOTE — Patient Instructions (Signed)
Access Code: [email or web address] URL: [email or web address] Date: 09/10/2020 Prepared by: Cherly Anderson  Exercises Supine Bridge - 2 x daily - 5 x weekly - 2 sets - 10 reps Supine Heel Slide - 2 x daily - 5 x weekly - 2 sets - 10 reps Supine March - 2 x daily - 5 x weekly - 2 sets - 10 reps Supine Single Bent Knee Fallout - 2 x daily - 5 x weekly - 2 sets - 10 reps Seated Heel Slide - 2 x daily - 5 x weekly - 2 sets - 10 reps Standing Weight Shift Side to Side - 2 x daily - 5 x weekly - 2 sets - 10 reps

## 2020-09-17 ENCOUNTER — Ambulatory Visit: Payer: Medicaid Other | Admitting: Occupational Therapy

## 2020-09-17 ENCOUNTER — Other Ambulatory Visit: Payer: Self-pay

## 2020-09-17 DIAGNOSIS — M6281 Muscle weakness (generalized): Secondary | ICD-10-CM

## 2020-09-17 DIAGNOSIS — R2689 Other abnormalities of gait and mobility: Secondary | ICD-10-CM | POA: Diagnosis not present

## 2020-09-17 DIAGNOSIS — I69354 Hemiplegia and hemiparesis following cerebral infarction affecting left non-dominant side: Secondary | ICD-10-CM

## 2020-09-17 DIAGNOSIS — R278 Other lack of coordination: Secondary | ICD-10-CM

## 2020-09-17 NOTE — Therapy (Signed)
[facility] [address] [place], [place], [address] Phone: [phone]   Fax:  [phone]  Occupational Therapy Treatment  Patient Details  Name: Erica Cooper MRN: [id number] Date of Birth: 07/07/1950 Referring Provider (OT): Dr. Jeanie Cooks   Encounter Date: 09/17/2020   OT End of Session - 09/17/20 1301    Visit Number 5    Number of Visits 9    Authorization Time Period 8 visits through 10/16/20    Authorization - Visit Number 4    Authorization - Number of Visits 8    OT Start Time 0850    OT Stop Time 0933    OT Time Calculation (min) 43 min    Activity Tolerance Patient tolerated treatment well           Past Medical History:  Diagnosis Date  . Diabetes ([place])    borderline/ no meds  . GERD (gastroesophageal reflux disease)   . GI bleeding 09/2014   after colon polypectomy  . Hx of adenomatous colonic polyps 10/07/2014  . Hyperlipidemia   . Hypertension   . Obesity   . Stroke [facility]) 2007   weakness left side    Past Surgical History:  Procedure Laterality Date  . COLONOSCOPY    . COLONOSCOPY N/A 10/08/2014   Procedure: COLONOSCOPY;  Surgeon: Gatha Mayer, MD;  Location: [facility];  Service: Endoscopy;  Laterality: N/A;    There were no vitals filed for this visit.   Subjective Assessment - 09/17/20 1301    Subjective  Pt reports continued back pain    Pertinent History CVA in 2007    Patient Stated Goals I want to get my hand work    Currently in Pain? Yes    Pain Score 3     Pain Location Back    Pain Orientation Right    Pain Descriptors / Indicators Aching    Pain Type Chronic pain    Pain Onset More than a month ago    Pain Frequency Intermittent    Aggravating Factors  malpositioning    Pain Relieving Factors heat    Multiple Pain Sites No                Treatment:Pt performed tableslides for shoulder flexion along tabletop, then self ROM supination/ pronation,  min v.c Therapist fabricated a custom short opponens splint for improved functional use of LUE/ positioning of thumb. Therapist did not issue due to time constraints.  Will have pt practice with use prior to sending it home with her. Hotpack applied to pt's low back x 20 mins for pain, no advere reactions                  OT Short Term Goals - 09/10/20 1059      OT SHORT TERM GOAL #1   Title I with HEP    Baseline dependent    Time 4    Period Weeks    Status On-going    Target Date 10/01/20      OT SHORT TERM GOAL #2   Title Pt will be modified I with donning shirt, pants,    Baseline min A    Time 4    Period Weeks    Status New      OT SHORT TERM GOAL #3   Title Pt will perform basic home management modified I    Baseline requires assistance    Time 4    Period Weeks  Status New      OT SHORT TERM GOAL #4   Title Pt will perform simple cooking modified I    Baseline requires assistance    Time 4    Period Weeks    Status New      OT SHORT TERM GOAL #5   Title Pt will use LUE consistently for ADLs/IADLS as a non dominant assist at least 25% of the time    Baseline does not use LUE consistently    Time 4    Period Weeks    Status New             OT Long Term Goals - 08/06/20 1350      OT LONG TERM GOAL #1   Title Independent w/ updated  HEP for LUE    Baseline ddependent    Time 8    Period Weeks    Status New    Target Date 10/29/20      OT LONG TERM GOAL #2   Title Pt will verbalize understanding of adaped strategies/ AE to increase safety and I with ADLS/IADLS (such as cutting food)    Baseline dependent    Time 8    Period Weeks    Status New      OT LONG TERM GOAL #3   Title Independent with splint wear and care L hand for improved positioning prn    Baseline dependent    Time 8    Period Weeks    Status New      OT LONG TERM GOAL #4   Title Improve LUE function as evidenced by performing 6 blocks or more on Box & Blocks  test    Baseline 3 blocks for LUE    Time 8    Period Weeks    Status New                  Patient will benefit from skilled therapeutic intervention in order to improve the following deficits and impairments:           Visit Diagnosis: Muscle weakness (generalized)  Hemiplegia and hemiparesis following cerebral infarction affecting left non-dominant side ([facility])  Other lack of coordination    Problem List Patient Active Problem List   Diagnosis Date Noted  . Hx of adenomatous colonic polyps 10/07/2014  . Dyspnea 08/27/2013  . Abnormal ECG 08/27/2013  . Stroke ([place])   . Hypertension   . Diabetes ([place])   . Obesity   . Hyperlipidemia     Erica Cooper 09/17/2020, 1:04 PM  [facility] [address] [place], [place], [address] Phone: [phone]   Fax:  [phone]  Name: Erica Cooper MRN: [id number] Date of Birth: 16-Mar-1951

## 2020-09-21 NOTE — Telephone Encounter (Signed)
Dr. Jeanie Cooks, Can you have them resend the fax? We have not had anything come through on our side. I just checked with the front office. Thanks so much! Raquel Sarna

## 2020-09-24 ENCOUNTER — Ambulatory Visit: Payer: Medicaid Other | Admitting: Occupational Therapy

## 2020-09-24 ENCOUNTER — Ambulatory Visit: Payer: Medicaid Other

## 2020-09-24 ENCOUNTER — Other Ambulatory Visit: Payer: Self-pay

## 2020-09-24 DIAGNOSIS — I69354 Hemiplegia and hemiparesis following cerebral infarction affecting left non-dominant side: Secondary | ICD-10-CM

## 2020-09-24 DIAGNOSIS — R2689 Other abnormalities of gait and mobility: Secondary | ICD-10-CM | POA: Diagnosis not present

## 2020-09-24 DIAGNOSIS — M6281 Muscle weakness (generalized): Secondary | ICD-10-CM

## 2020-09-24 DIAGNOSIS — R278 Other lack of coordination: Secondary | ICD-10-CM

## 2020-09-24 NOTE — Therapy (Signed)
[facility] [address] [place], [place], [address] Phone: [phone]   Fax:  [phone]  Occupational Therapy Treatment  Patient Details  Name: Erica Cooper MRN: [id number] Date of Birth: 1950-06-29 Referring Provider (OT): Dr. Jeanie Cooks   Encounter Date: 09/24/2020   OT End of Session - 09/24/20 1313    Visit Number 6    Number of Visits 9    Date for OT Re-Evaluation 10/29/20    Authorization Type [employer]    Authorization Time Period 8 visits through 10/16/20    Authorization - Visit Number 5    Authorization - Number of Visits 8           Past Medical History:  Diagnosis Date  . Diabetes ([place])    borderline/ no meds  . GERD (gastroesophageal reflux disease)   . GI bleeding 09/2014   after colon polypectomy  . Hx of adenomatous colonic polyps 10/07/2014  . Hyperlipidemia   . Hypertension   . Obesity   . Stroke [facility]) 2007   weakness left side    Past Surgical History:  Procedure Laterality Date  . COLONOSCOPY    . COLONOSCOPY N/A 10/08/2014   Procedure: COLONOSCOPY;  Surgeon: Gatha Mayer, MD;  Location: [place] [facility];  Service: Endoscopy;  Laterality: N/A;    There were no vitals filed for this visit.   Subjective Assessment - 09/24/20 1312    Subjective  Pt reports continued back pain    Pertinent History CVA in 2007    Currently in Pain? Yes    Pain Score 5     Pain Location Back    Pain Orientation Left    Pain Descriptors / Indicators Aching    Pain Type Chronic pain    Pain Onset 1 to 4 weeks ago    Pain Frequency Intermittent    Aggravating Factors  malpositioning    Pain Relieving Factors repositioning                 Treatment:Therapist completed short opponens splint modifications. Pt used splint to pick up dowel pegs with therapist providing support at wrist and then pt performed self support. Pt utilized oval 8 splint on thumb with improved pinch for  dowel pegs. Tableslides for shoulder flexion, A/ROM wrist extension, and supination/ pronation. Pushing dowel along lap with bilateral UE's, mod facilitation for low range shoulder flexion.                OT Education - 09/24/20 1647    Education Details Pt was educated that the tennis elbow splint / exerciser she brought in would not be good for her, it is designed for a different reason.    Person(s) Educated Patient    Methods Explanation    Comprehension Verbalized understanding            OT Short Term Goals - 09/24/20 1009      OT SHORT TERM GOAL #1   Title I with HEP    Baseline dependent    Time 4    Period Weeks    Status On-going    Target Date 10/01/20      OT SHORT TERM GOAL #2   Title Pt will be modified I with donning shirt, pants,    Baseline min A    Time 4    Period Weeks    Status Achieved      OT SHORT TERM GOAL #3   Title Pt will perform  basic home management modified I    Baseline requires assistance    Time 4    Period Weeks    Status On-going      OT SHORT TERM GOAL #4   Title Pt will perform simple cooking modified I    Baseline requires assistance    Time 4    Period Weeks    Status On-going      OT SHORT TERM GOAL #5   Title Pt will use LUE consistently for ADLs/IADLS as a non dominant assist at least 25% of the time    Baseline does not use LUE consistently    Time 4    Period Weeks    Status On-going             OT Long Term Goals - 08/06/20 1350      OT LONG TERM GOAL #1   Title Independent w/ updated  HEP for LUE    Baseline ddependent    Time 8    Period Weeks    Status New    Target Date 10/29/20      OT LONG TERM GOAL #2   Title Pt will verbalize understanding of adaped strategies/ AE to increase safety and I with ADLS/IADLS (such as cutting food)    Baseline dependent    Time 8    Period Weeks    Status New      OT LONG TERM GOAL #3   Title Independent with splint wear and care L hand for  improved positioning prn    Baseline dependent    Time 8    Period Weeks    Status New      OT LONG TERM GOAL #4   Title Improve LUE function as evidenced by performing 6 blocks or more on Box & Blocks test    Baseline 3 blocks for LUE    Time 8    Period Weeks    Status New                 Plan - 09/24/20 1313    Clinical Impression Statement Pt is progressing towards goals. Therapist completed fitting short opponens splint today. Therapist did not issue yet, will plan to use in clinic initally.    OT Occupational Profile and History Problem Focused Assessment - Including review of records relating to presenting problem    Occupational performance deficits (Please refer to evaluation for details): ADL's;IADL's;Leisure;Social Participation    Body Structure / Function / Physical Skills ADL;UE functional use;Balance;Flexibility;FMC;ROM;GMC;Coordination;Decreased knowledge of precautions;IADL;Decreased knowledge of use of DME;Dexterity;Strength;Tone;Mobility    Rehab Potential Good    OT Frequency 1x / week    OT Duration 8 weeks    OT Treatment/Interventions Self-care/ADL training;Visual/perceptual remediation/compensation;Patient/family education;Balance training;Cognitive remediation/compensation;Neuromuscular education;Therapeutic activities;Manual Therapy;Therapeutic exercise;Moist Heat;Splinting;Functional Mobility Training;Electrical Stimulation;Stair Training;Fluidtherapy;Cryotherapy;Passive range of motion;Paraffin;Aquatic Therapy;DME and/or AE instruction;Ultrasound    Plan funtional use of LUE, print out information regarding chopper for cutting vergetables    Consulted and Agree with Plan of Care Patient;Family member/caregiver    Family Member Consulted husband           Patient will benefit from skilled therapeutic intervention in order to improve the following deficits and impairments:   Body Structure / Function / Physical Skills: ADL,UE functional  use,Balance,Flexibility,FMC,ROM,GMC,Coordination,Decreased knowledge of precautions,IADL,Decreased knowledge of use of DME,Dexterity,Strength,Tone,Mobility       Visit Diagnosis: Hemiplegia and hemiparesis following cerebral infarction affecting left non-dominant side (HCC)  Muscle weakness (generalized)  Other lack of coordination  Problem List Patient Active Problem List   Diagnosis Date Noted  . Hx of adenomatous colonic polyps 10/07/2014  . Dyspnea 08/27/2013  . Abnormal ECG 08/27/2013  . Stroke ([place])   . Hypertension   . Diabetes ([place])   . Obesity   . Hyperlipidemia     Shanita Kanan 09/24/2020, 4:49 PM  [facility] [address] [place], [place], [address] Phone: [phone]   Fax:  [phone]  Name: Raia Amico MRN: [id number] Date of Birth: [birth year]

## 2020-09-24 NOTE — Therapy (Signed)
[facility] [address] [place], [place], [address] Phone: [phone]   Fax:  [phone]  Physical Therapy Treatment  Patient Details  Name: Erica Cooper MRN: [id number] Date of Birth: 07-11-1950 Referring Provider (PT): Nolene Ebbs   Encounter Date: 09/24/2020   PT End of Session - 09/24/20 0856    Visit Number 7    Number of Visits 9    Authorization Type [employer] medicaid 3/17-5/16 12 visits    Authorization - Visit Number 5    Authorization - Number of Visits 12    PT Start Time 0630    PT Stop Time 0933    PT Time Calculation (min) 39 min    Equipment Utilized During Treatment Gait belt    Activity Tolerance Patient tolerated treatment well           Past Medical History:  Diagnosis Date  . Diabetes ([place])    borderline/ no meds  . GERD (gastroesophageal reflux disease)   . GI bleeding 09/2014   after colon polypectomy  . Hx of adenomatous colonic polyps 10/07/2014  . Hyperlipidemia   . Hypertension   . Obesity   . Stroke [facility]) 2007   weakness left side    Past Surgical History:  Procedure Laterality Date  . COLONOSCOPY    . COLONOSCOPY N/A 10/08/2014   Procedure: COLONOSCOPY;  Surgeon: Gatha Mayer, MD;  Location: [facility];  Service: Endoscopy;  Laterality: N/A;    There were no vitals filed for this visit.   Subjective Assessment - 09/24/20 0856    Subjective Pt reports back is some better but still has 5/10 pain. Doctor prescribed some medicine for her to take at night and she is sleeping well. Will bring the medicine next time so we can add.    Patient is accompained by: Family member   husband and son   Patient Stated Goals Pt would like to get stronger to walk better    Currently in Pain? Yes    Pain Score 5     Pain Location Back    Pain Orientation Left    Pain Descriptors / Indicators Sore    Pain Type Chronic pain    Pain Onset 1 to 4 weeks ago    Pain Frequency  Intermittent    Aggravating Factors  certain movements    Pain Relieving Factors medicine, rest                             [facility] Adult PT Treatment/Exercise - 09/24/20 0906      Transfers   Transfers Sit to Stand;Stand to Sit    Sit to Stand 6: Modified independent (Device/Increase time)    Sit to Stand Details (indicate cue type and reason) 34 sec from chair without hands    Stand to Sit 6: Modified independent (Device/Increase time)      Ambulation/Gait   Ambulation/Gait Yes    Ambulation/Gait Assistance 6: Modified independent (Device/Increase time);5: Supervision    Ambulation/Gait Assistance Details Pt was given verbal cues for sequencing with cane.    Ambulation Distance (Feet) 600 Feet    Assistive device Straight cane   left posterior [place] AFO donned   Gait Pattern Step-through pattern;Decreased hip/knee flexion - left    Ambulation Surface Level;Indoor    Gait velocity 22.53 sec=0.20ms      Standardized Balance Assessment   Standardized Balance Assessment Berg Balance Test  Berg Balance Test   Sit to Stand Able to stand without using hands and stabilize independently    Standing Unsupported Able to stand safely 2 minutes    Sitting with Back Unsupported but Feet Supported on Floor or Stool Able to sit safely and securely 2 minutes    Stand to Sit Sits safely with minimal use of hands    Transfers Able to transfer safely, minor use of hands    Standing Unsupported with Eyes Closed Able to stand 10 seconds safely    Standing Ubsupported with Feet Together Able to place feet together independently and stand 1 minute safely    From Standing, Reach Forward with Outstretched Arm Can reach forward >12 cm safely (5")   pain in back limits   From Standing Position, Pick up Object from Floor Able to pick up shoe safely and easily   pain in back with coming back up   From Standing Position, Turn to Look Behind Over each Shoulder Turn sideways only but  maintains balance    Turn 360 Degrees Able to turn 360 degrees safely but slowly    Standing Unsupported, Alternately Place Feet on Step/Stool Able to stand independently and safely and complete 8 steps in 20 seconds    Standing Unsupported, One Foot in Front Able to plae foot ahead of the other independently and hold 30 seconds    Standing on One Leg Tries to lift leg/unable to hold 3 seconds but remains standing independently    Total Score 47                  PT Education - 09/24/20 1041    Education Details Discussed going on hold until new AFO received. Pt to call once she gets to get back on schedule or let OT know. Gave contact info to [facility] if they haven't heard by early next week.    Person(s) Educated Patient;Spouse    Methods Explanation    Comprehension Verbalized understanding            PT Short Term Goals - 09/03/20 1314      PT SHORT TERM GOAL #1   Title Pt will be independent with initial HEP for strengthening, flexibility and balance.    Baseline 09/03/20 pt reports working on current HEP and denies any questions.    Time 4    Period Weeks    Status Achieved    Target Date 08/29/20      PT SHORT TERM GOAL #2   Title Pt will decrease TUG form 19.07 sec to <16 sec for improved balance and functional mobility.    Baseline 07/29/20 19.07 sec. 09/03/20 26 sec    Time 4    Period Weeks    Status Not Met    Target Date 08/29/20      PT SHORT TERM GOAL #3   Title Pt will ambulate >300' on level surfaces without AD independently for improved household and short community distances.    Baseline 07/29/20 100' CGA with decreased left foot clearance. 09/03/20 345' supervision but more due to working on gait quality.    Time 4    Period Weeks    Status Partially Met    Target Date 08/29/20      PT SHORT TERM GOAL #4   Title Berg Balance will be assessed to further assess balance.    Baseline Berg performed 08/13/20 46/56    Time 4    Period Weeks    Status   Achieved     Target Date 08/29/20             PT Long Term Goals - 09/24/20 0904      PT LONG TERM GOAL #1   Title Pt will be independent with progressive HEP for strength, flexibility and balance to continue gains on own.    Baseline 09/24/20 Pt has been given initial HEP but has not been working on recently due to the pain she has developed in her back.    Time 8    Period Weeks    Status On-going      PT LONG TERM GOAL #2   Title Pt will ambulate >600' on varied surfaces with LRAD versus no device mod I for improved community mobility.    Baseline 09/24/20 600' with cane and left AFO supervision/mod I on level surfaces    Time 8    Period Weeks    Status Partially Met      PT LONG TERM GOAL #3   Title Pt will increase gait speed from 0.46m/s to >0.6m/s for improved mobility.    Baseline 07/29/20 0.46m/s without AD. 09/24/20 0.44m/s    Time 8    Period Weeks    Status Not Met      PT LONG TERM GOAL #4   Title Pt will decrease 5 x sit to stand from 30.79 sec to <22 sec for improved balance and functional strength.    Baseline 07/29/20 30.79 sec from chair without hands. 09/24/20 34 sec from chair without hands. Pt having back pain which is slowing her.    Time 8    Period Weeks    Status Not Met      PT LONG TERM GOAL #5   Title Pt will increase Berg from 46/56 to >49/56 for improved balance.    Baseline 08/13/20 46/56. 09/24/20 47/56    Time 8    Period Weeks    Status On-going                 Plan - 09/24/20 1035    Clinical Impression Statement PT had received AFO order from MD. Discussed with pt about placing on hold until her AFO is received to conserve visits. Reassessed LTGs to get new baselines at visit. Pt does show improved gait quality with use of left AFO and cane with better foot clearance and less hip hike an right lateral lean. Pt reports feeling better as well. Pt's low back pain has improved some but continues to limit her with activities and she is moving slower  on some of the testing due to this. She was not able to improve on 5 x sit to stand or gait speed. Improved 1 point on Berg Balance to 47/56 indicating moderate fall risk. Pt continues to benefit from skilled PT to continue to progress her strength, balance and functional mobility. She will call to get back on schedule when AFO is received. PT faxed order over to [facility].    Personal Factors and Comorbidities Comorbidity 3+;Time since onset of injury/illness/exacerbation    Comorbidities DM, stroke 2007, HTN, OA    Examination-Activity Limitations Stairs;Locomotion Level;Stand;Transfers;Dressing    Examination-Participation Restrictions Community Activity;Cleaning;Laundry    Stability/Clinical Decision Making Evolving/Moderate complexity    Rehab Potential Good    PT Frequency 1x / week   plus eval   PT Duration 8 weeks    PT Treatment/Interventions ADLs/Self Care Home Management;DME Instruction;Therapeutic activities;Functional mobility training;Gait training;Stair training;Therapeutic exercise;Balance   training;Neuromuscular re-education;Orthotic Fit/Training;Patient/family education;Manual techniques;Passive range of motion;Vestibular    PT Next Visit Plan Pt on hold until receives new AFO. Current medicaid auth through 5/16 so will depend when she gets back on if need to request more auth prior. Gait with new AFO and cane. Needs more hip/knee flexion to help clear foot.Continue NMR for left weight shift and hip strengthening. Continue with tall kneeling activities.    Consulted and Agree with Plan of Care Patient;Family member/caregiver    Family Member Consulted husband           Patient will benefit from skilled therapeutic intervention in order to improve the following deficits and impairments:  Abnormal gait,Decreased activity tolerance,Decreased balance,Decreased endurance,Decreased knowledge of use of DME,Decreased strength,Impaired tone,Decreased  mobility,Decreased range of motion,Impaired flexibility  Visit Diagnosis: Other abnormalities of gait and mobility  Hemiplegia and hemiparesis following cerebral infarction affecting left non-dominant side (HCC)  Muscle weakness (generalized)     Problem List Patient Active Problem List   Diagnosis Date Noted  . Hx of adenomatous colonic polyps 10/07/2014  . Dyspnea 08/27/2013  . Abnormal ECG 08/27/2013  . Stroke (HCC)   . Hypertension   . Diabetes (HCC)   . Obesity   . Hyperlipidemia     Emily A Parker, PT, DPT, NCS 09/24/2020, 10:43 AM  [facility] [address] [place], [place], [address] Phone: [phone]   Fax:  [phone]  Name: Ely Adeola Siegel MRN: [id number] Date of Birth: 03/02/1951   

## 2020-10-01 ENCOUNTER — Ambulatory Visit: Payer: Medicaid Other | Attending: Internal Medicine | Admitting: Occupational Therapy

## 2020-10-01 ENCOUNTER — Ambulatory Visit: Payer: Medicaid Other

## 2020-10-01 ENCOUNTER — Other Ambulatory Visit: Payer: Self-pay

## 2020-10-01 DIAGNOSIS — M6281 Muscle weakness (generalized): Secondary | ICD-10-CM | POA: Diagnosis present

## 2020-10-01 DIAGNOSIS — I69354 Hemiplegia and hemiparesis following cerebral infarction affecting left non-dominant side: Secondary | ICD-10-CM | POA: Diagnosis present

## 2020-10-01 DIAGNOSIS — R278 Other lack of coordination: Secondary | ICD-10-CM | POA: Diagnosis present

## 2020-10-01 DIAGNOSIS — R2689 Other abnormalities of gait and mobility: Secondary | ICD-10-CM | POA: Insufficient documentation

## 2020-10-01 NOTE — Patient Instructions (Addendum)
Wear your wrist brace for several hours per day along with your hard thumb splint.  If brace bothers your or is painful, remove splint. Make sure to bring both splints to your next therapy appointment.                     Flexion (Assistive)    Clasp hands together and raise arms above head, keeping elbows as straight as possible. Can be done sitting or lying. Repeat _20___ times. Do ___2_ sessions per day.  Copyright  VHI. All rights reserved.     Laying on your left side with your arm stretched out on the bed, bend and straighten your elbow 20 times  Then turn your palm up and down 20 times

## 2020-10-01 NOTE — Therapy (Signed)
[facility] [address] [place], [place], [address] Phone: [phone]   Fax:  [phone]  Occupational Therapy Treatment  Patient Details  Name: Erica Cooper MRN: [id number] Date of Birth: 1950-09-26 Referring Provider (OT): Dr. Jeanie Cooks   Encounter Date: 10/01/2020   OT End of Session - 10/01/20 1038    Visit Number 7    Number of Visits 9    Date for OT Re-Evaluation 10/29/20    Authorization Type [employer]    Authorization Time Period 8 visits through 10/16/20    Authorization - Visit Number 6    Authorization - Number of Visits 8           Past Medical History:  Diagnosis Date  . Diabetes ([place])    borderline/ no meds  . GERD (gastroesophageal reflux disease)   . GI bleeding 09/2014   after colon polypectomy  . Hx of adenomatous colonic polyps 10/07/2014  . Hyperlipidemia   . Hypertension   . Obesity   . Stroke [facility]) 2007   weakness left side    Past Surgical History:  Procedure Laterality Date  . COLONOSCOPY    . COLONOSCOPY N/A 10/08/2014   Procedure: COLONOSCOPY;  Surgeon: Gatha Mayer, MD;  Location: [facility];  Service: Endoscopy;  Laterality: N/A;    There were no vitals filed for this visit.   Subjective Assessment - 10/01/20 0954    Subjective  Pt denies pain today    Patient Stated Goals I want to get my hand work    Currently in Pain? No/denies                 Treatment: Pt was fitted with a pre fab wrist brace she uses in conjuction with oval 8.(short opponens does not provide adequate wrist support). Pt performed grasp, release of wooden dowel pegs with LUE with mod difficulty and v/c to avoid compensation. Pt ambulates with cane, she is awaiting AFO.               OT Education - 10/01/20 1036    Education Details Pt was educated in prefab wrist brace wear, care and precautions. Pt demonstrates ability to donn and doff. Pt was instructed in  HEP for shoulder flexion in supne and elbow flexion/ extension, supination/ pronation in sidelying- see pt instructions. Pt was educated in bed postioning and handout was issued. Pt returned demonstion.    Person(s) Educated Patient;Spouse    Methods Explanation;Demonstration;Verbal cues;Handout    Comprehension Verbalized understanding;Returned demonstration;Verbal cues required            OT Short Term Goals - 10/01/20 1042      OT SHORT TERM GOAL #1   Title I with HEP    Baseline dependent    Time 4    Period Weeks    Status Achieved    Target Date 10/01/20      OT SHORT TERM GOAL #2   Title Pt will be modified I with donning shirt, pants,    Baseline min A    Time 4    Period Weeks    Status Achieved      OT SHORT TERM GOAL #3   Title Pt will perform basic home management modified I    Baseline requires assistance    Time 4    Period Weeks    Status On-going      OT SHORT TERM GOAL #4   Title Pt will perform simple cooking  modified I    Baseline requires assistance    Time 4    Period Weeks    Status On-going      OT SHORT TERM GOAL #5   Title Pt will use LUE consistently for ADLs/IADLS as a non dominant assist at least 25% of the time    Baseline does not use LUE consistently    Time 4    Period Weeks    Status On-going             OT Long Term Goals - 08/06/20 1350      OT LONG TERM GOAL #1   Title Independent w/ updated  HEP for LUE    Baseline ddependent    Time 8    Period Weeks    Status New    Target Date 10/29/20      OT LONG TERM GOAL #2   Title Pt will verbalize understanding of adaped strategies/ AE to increase safety and I with ADLS/IADLS (such as cutting food)    Baseline dependent    Time 8    Period Weeks    Status New      OT LONG TERM GOAL #3   Title Independent with splint wear and care L hand for improved positioning prn    Baseline dependent    Time 8    Period Weeks    Status New      OT LONG TERM GOAL #4   Title  Improve LUE function as evidenced by performing 6 blocks or more on Box & Blocks test    Baseline 3 blocks for LUE    Time 8    Period Weeks    Status New                 Plan - 10/01/20 1039    Clinical Impression Statement Pt [place] progressing towards goals. Due to wrist weakness, therapist issued a pre fab wrist brace which pt uses in conjuction with oval-8 for her thumb. Pt reports splint is comfortable.    OT Occupational Profile and History Problem Focused Assessment - Including review of records relating to presenting problem    Occupational performance deficits (Please refer to evaluation for details): ADL's;IADL's;Leisure;Social Participation    Body Structure / Function / Physical Skills ADL;UE functional use;Balance;Flexibility;FMC;ROM;GMC;Coordination;Decreased knowledge of precautions;IADL;Decreased knowledge of use of DME;Dexterity;Strength;Tone;Mobility    Rehab Potential Good    OT Frequency 1x / week    OT Duration 8 weeks    OT Treatment/Interventions Self-care/ADL training;Visual/perceptual remediation/compensation;Patient/family education;Balance training;Cognitive remediation/compensation;Neuromuscular education;Therapeutic activities;Manual Therapy;Therapeutic exercise;Moist Heat;Splinting;Functional Mobility Training;Electrical Stimulation;Stair Training;Fluidtherapy;Cryotherapy;Passive range of motion;Paraffin;Aquatic Therapy;DME and/or AE instruction;Ultrasound    Plan review HEP, splint check, light functional use of LUE wear wrist brace and oval 8 for home management, fold towels, wipe tabletop, get items out of fridge.. Pt only has 2 remaining visits and should likely conserve visits for later in the year if needed.    Consulted and Agree with Plan of Care Patient;Family member/caregiver    Family Member Consulted husband           Patient will benefit from skilled therapeutic intervention in order to improve the following deficits and impairments:   Body  Structure / Function / Physical Skills: ADL,UE functional use,Balance,Flexibility,FMC,ROM,GMC,Coordination,Decreased knowledge of precautions,IADL,Decreased knowledge of use of DME,Dexterity,Strength,Tone,Mobility       Visit Diagnosis: Hemiplegia and hemiparesis following cerebral infarction affecting left non-dominant side (HCC)  Muscle weakness (generalized)  Other lack of coordination    Problem  List Patient Active Problem List   Diagnosis Date Noted  . Hx of adenomatous colonic polyps 10/07/2014  . Dyspnea 08/27/2013  . Abnormal ECG 08/27/2013  . Stroke ([place])   . Hypertension   . Diabetes ([place])   . Obesity   . Hyperlipidemia     Erica Cooper 10/01/2020, 10:44 AM  [facility] [address] [place], [place], [address] Phone: [phone]   Fax:  [phone]  Name: Erica Cooper MRN: [id number] Date of Birth: [birth year]

## 2020-10-08 ENCOUNTER — Ambulatory Visit: Payer: Medicaid Other | Admitting: Occupational Therapy

## 2020-10-08 ENCOUNTER — Encounter: Payer: Self-pay | Admitting: Occupational Therapy

## 2020-10-08 ENCOUNTER — Other Ambulatory Visit: Payer: Self-pay

## 2020-10-08 DIAGNOSIS — R278 Other lack of coordination: Secondary | ICD-10-CM

## 2020-10-08 DIAGNOSIS — I69354 Hemiplegia and hemiparesis following cerebral infarction affecting left non-dominant side: Secondary | ICD-10-CM | POA: Diagnosis not present

## 2020-10-08 DIAGNOSIS — M6281 Muscle weakness (generalized): Secondary | ICD-10-CM

## 2020-10-08 NOTE — Patient Instructions (Signed)
   MP / PIP / DIP Composite Flexion (Passive Stretch)    Use other hand to bend right fingers at all three joints. Hold 20 seconds. Repeat 5 times. Do 3 sessions per day.  **Have your husband to help you for bigger stretch    MP Flexion (Assistive)    With hand resting on table, slide fingers to bend at large knuckles. Use other hand to help if needed. Hold 20 seconds. Repeat 5 times. Do 3 sessions per day.  **Have your husband to help you for bigger stretch     Opposition (Active)    Touch tip of thumb to nail tip of each finger in turn, making an "O" shape. Repeat 10 times. Do 3 sessions per day.      Cross Midline: Sitting    Reach one arm across body to grasp object. Move object to opposite side and release. 10  reps per set, 1-2 sets per day.  Wear new thumb splint.  Keep shoulder down       Splints:  Wear your wrist brace for 1-2 hours per day along with your hard thumb splint.  If brace bothers your or is painful, remove splint.  Wear soft black thumb splint while trying to use your left hand and picking up empty plastic bottles, or other light objects, folding wash cloths. Remove splint if having pain.

## 2020-10-08 NOTE — Therapy (Signed)
[facility] [address] [place], [place], [address] Phone: [phone]   Fax:  [phone]  Occupational Therapy Treatment  Patient Details  Name: Erica Cooper MRN: [id number] Date of Birth: 1951-03-20 Referring Provider (OT): Dr. Jeanie Cooks   Encounter Date: 10/08/2020   OT End of Session - 10/08/20 1432    Visit Number 8    Number of Visits 9    Date for OT Re-Evaluation 10/29/20    Authorization Type [employer]    Authorization Time Period 8 visits through 10/16/20    Authorization - Visit Number 7    Authorization - Number of Visits 8    OT Start Time 1022    OT Stop Time 1101    OT Time Calculation (min) 39 min           Past Medical History:  Diagnosis Date  . Diabetes ([place])    borderline/ no meds  . GERD (gastroesophageal reflux disease)   . GI bleeding 09/2014   after colon polypectomy  . Hx of adenomatous colonic polyps 10/07/2014  . Hyperlipidemia   . Hypertension   . Obesity   . Stroke [facility]) 2007   weakness left side    Past Surgical History:  Procedure Laterality Date  . COLONOSCOPY    . COLONOSCOPY N/A 10/08/2014   Procedure: COLONOSCOPY;  Surgeon: Gatha Mayer, MD;  Location: [facility];  Service: Endoscopy;  Laterality: N/A;    There were no vitals filed for this visit.   Subjective Assessment - 10/08/20 1431    Subjective  Pt reports that she is excited for new splint    Pertinent History CVA in 2007    Patient Stated Goals I want to get my hand work    Currently in Pain? No/denies           Gentle PROM to fingers in composite finger flex and isolated MP flex due to stiffness with improved AROM after PROM.    With support at thumb CMC, active ROM opposition to 2nd digit.   Pt reports no problems with pre-fab wrist splint and oval 8 for thumb IP ext, but demo difficulty with grasp of cylinder objects and folding washcloths during trial today.    Pt fitted for  soft pre-fab CMC splint and pt demo improved ability to grasp/release small-medium cylinder objects with min facilitation and mod cueing for proximal LUE compensation initially, improved with repetition.  Practiced folding wash cloths, sweeping (with shelf liner on broom), opening drawers/cabinets/doors while wearing pre-fab CMC splint with min-mod difficulty/cues initially, but them mod I.           OT Education - 10/08/20 1432    Education Details Pre-fab CMC splint wear/care; Additions to HEP--see pt instructions    Person(s) Educated Patient    Methods Explanation;Demonstration;Verbal cues;Handout;Tactile cues    Comprehension Verbalized understanding;Returned demonstration;Verbal cues required;Need further instruction            OT Short Term Goals - 10/01/20 1042      OT SHORT TERM GOAL #1   Title I with HEP    Baseline dependent    Time 4    Period Weeks    Status Achieved    Target Date 10/01/20      OT SHORT TERM GOAL #2   Title Pt will be modified I with donning shirt, pants,    Baseline min A    Time 4    Period Weeks    Status  Achieved      OT SHORT TERM GOAL #3   Title Pt will perform basic home management modified I    Baseline requires assistance    Time 4    Period Weeks    Status On-going      OT SHORT TERM GOAL #4   Title Pt will perform simple cooking modified I    Baseline requires assistance    Time 4    Period Weeks    Status On-going      OT SHORT TERM GOAL #5   Title Pt will use LUE consistently for ADLs/IADLS as a non dominant assist at least 25% of the time    Baseline does not use LUE consistently    Time 4    Period Weeks    Status On-going             OT Long Term Goals - 08/06/20 1350      OT LONG TERM GOAL #1   Title Independent w/ updated  HEP for LUE    Baseline ddependent    Time 8    Period Weeks    Status New    Target Date 10/29/20      OT LONG TERM GOAL #2   Title Pt will verbalize understanding of adaped  strategies/ AE to increase safety and I with ADLS/IADLS (such as cutting food)    Baseline dependent    Time 8    Period Weeks    Status New      OT LONG TERM GOAL #3   Title Independent with splint wear and care L hand for improved positioning prn    Baseline dependent    Time 8    Period Weeks    Status New      OT LONG TERM GOAL #4   Title Improve LUE function as evidenced by performing 6 blocks or more on Box & Blocks test    Baseline 3 blocks for LUE    Time 8    Period Weeks    Status New                 Plan - 10/08/20 1433    Clinical Impression Statement Pt is progresing towards goals.  Pt demo improved ability to grasp/release small-medium cylinder objects with soft pre-fab CMC splint today.  Pt to continue to wear oval 8 some for thumb IP stretch.    OT Occupational Profile and History Problem Focused Assessment - Including review of records relating to presenting problem    Occupational performance deficits (Please refer to evaluation for details): ADL's;IADL's;Leisure;Social Participation    Body Structure / Function / Physical Skills ADL;UE functional use;Balance;Flexibility;FMC;ROM;GMC;Coordination;Decreased knowledge of precautions;IADL;Decreased knowledge of use of DME;Dexterity;Strength;Tone;Mobility    Rehab Potential Good    OT Frequency 1x / week    OT Duration 8 weeks    OT Treatment/Interventions Self-care/ADL training;Visual/perceptual remediation/compensation;Patient/family education;Balance training;Cognitive remediation/compensation;Neuromuscular education;Therapeutic activities;Manual Therapy;Therapeutic exercise;Moist Heat;Splinting;Functional Mobility Training;Electrical Stimulation;Stair Training;Fluidtherapy;Cryotherapy;Passive range of motion;Paraffin;Aquatic Therapy;DME and/or AE instruction;Ultrasound    Plan splint check, light functional use of LUE with splint; review updates to HEP, check remaining goals with likely d/c to conserve visits  for later in the year if needed.    Consulted and Agree with Plan of Care Patient;Family member/caregiver    Family Member Consulted husband           Patient will benefit from skilled therapeutic intervention in order to improve the following deficits and impairments:   Body Structure /  Function / Physical Skills: ADL,UE functional use,Balance,Flexibility,FMC,ROM,GMC,Coordination,Decreased knowledge of precautions,IADL,Decreased knowledge of use of DME,Dexterity,Strength,Tone,Mobility       Visit Diagnosis: Hemiplegia and hemiparesis following cerebral infarction affecting left non-dominant side (HCC)  Muscle weakness (generalized)  Other lack of coordination    Problem List Patient Active Problem List   Diagnosis Date Noted  . Hx of adenomatous colonic polyps 10/07/2014  . Dyspnea 08/27/2013  . Abnormal ECG 08/27/2013  . Stroke ([place])   . Hypertension   . Diabetes ([place])   . Obesity   . Hyperlipidemia     [facility] 10/08/2020, 2:36 PM  [facility] [address] [place], [place], [address] Phone: [phone]   Fax:  [phone]  Name: Amily Depp MRN: [id number] Date of Birth: [birth year]   Vianne Bulls, OTR/L [facility] [address] [place], [place]  [address] [phone] phone [phone] 10/08/20 2:36 PM

## 2020-10-12 ENCOUNTER — Other Ambulatory Visit: Payer: Self-pay | Admitting: Internal Medicine

## 2020-10-13 LAB — COMPLETE METABOLIC PANEL WITH GFR
AG Ratio: 1.5 (calc) (ref 1.0–2.5)
ALT: 27 U/L (ref 6–29)
AST: 23 U/L (ref 10–35)
Albumin: 4.3 g/dL (ref 3.6–5.1)
Alkaline phosphatase (APISO): 81 U/L (ref 37–153)
BUN: 11 mg/dL (ref 7–25)
CO2: 25 mmol/L (ref 20–32)
Calcium: 10 mg/dL (ref 8.6–10.4)
Chloride: 103 mmol/L (ref 98–110)
Creat: 0.67 mg/dL (ref 0.60–0.93)
GFR, Est African American: 102 mL/min/{1.73_m2} (ref >=60)
GFR, Est Non African American: 88 mL/min/{1.73_m2} (ref >=60)
Globulin: 2.8 g/dL (calc) (ref 1.9–3.7)
Glucose, Bld: 83 mg/dL (ref 65–99)
Potassium: 3.9 mmol/L (ref 3.5–5.3)
Sodium: 139 mmol/L (ref 135–146)
Total Bilirubin: 0.4 mg/dL (ref 0.2–1.2)
Total Protein: 7.1 g/dL (ref 6.1–8.1)

## 2020-10-13 LAB — LIPID PANEL
Cholesterol: 181 mg/dL (ref <200)
HDL: 65 mg/dL (ref >=50)
LDL Cholesterol (Calc): 95 mg/dL (calc)
Non-HDL Cholesterol (Calc): 116 mg/dL (calc) (ref <130)
Total CHOL/HDL Ratio: 2.8 (calc) (ref <5.0)
Triglycerides: 112 mg/dL (ref <150)

## 2020-10-13 LAB — URINE CULTURE
MICRO NUMBER:: 11894462
Result:: NO GROWTH
SPECIMEN QUALITY:: ADEQUATE

## 2020-10-13 LAB — CBC
HCT: 40.9 % (ref 35.0–45.0)
Hemoglobin: 12.8 g/dL (ref 11.7–15.5)
MCH: 27.5 pg (ref 27.0–33.0)
MCHC: 31.3 g/dL — ABNORMAL LOW (ref 32.0–36.0)
MCV: 87.8 fL (ref 80.0–100.0)
MPV: 10.3 fL (ref 7.5–12.5)
Platelets: 257 10*3/uL (ref 140–400)
RBC: 4.66 10*6/uL (ref 3.80–5.10)
RDW: 12.9 % (ref 11.0–15.0)
WBC: 5.9 10*3/uL (ref 3.8–10.8)

## 2020-10-13 LAB — TSH: TSH: 1.22 mIU/L (ref 0.40–4.50)

## 2020-10-15 ENCOUNTER — Other Ambulatory Visit: Payer: Self-pay

## 2020-10-15 ENCOUNTER — Encounter: Payer: Self-pay | Admitting: Occupational Therapy

## 2020-10-15 ENCOUNTER — Ambulatory Visit: Payer: Medicaid Other | Admitting: Occupational Therapy

## 2020-10-15 DIAGNOSIS — R278 Other lack of coordination: Secondary | ICD-10-CM

## 2020-10-15 DIAGNOSIS — I69354 Hemiplegia and hemiparesis following cerebral infarction affecting left non-dominant side: Secondary | ICD-10-CM

## 2020-10-15 DIAGNOSIS — M6281 Muscle weakness (generalized): Secondary | ICD-10-CM

## 2020-10-15 DIAGNOSIS — R2689 Other abnormalities of gait and mobility: Secondary | ICD-10-CM

## 2020-10-15 NOTE — Patient Instructions (Signed)
Lateral Weight Shift: Upper Trunk Leading   Sit with feet flat on floor. Bring __left__ shoulder, head and arm toward left  side then away from. Hold __10__ seconds. Return to upright position by pushing through arm Repeat _10___ times per session. Do __2__ sessions per day.   Radial Adduction/Abduction (Active)    Move thumb out to side. Move back alongside index finger. Repeat _10___ times. Do __2__ sessions per day.  Copyright  VHI. All rights reserved.

## 2020-10-16 ENCOUNTER — Ambulatory Visit
Admission: RE | Admit: 2020-10-16 | Discharge: 2020-10-16 | Disposition: A | Discharge status: Home / Self Care | Payer: Medicaid Other | Source: Ambulatory Visit | Attending: Internal Medicine | Admitting: Internal Medicine

## 2020-10-16 DIAGNOSIS — E2839 Other primary ovarian failure: Secondary | ICD-10-CM

## 2020-10-16 NOTE — Therapy (Signed)
[facility] [address] [place], [place], [address] Phone: [phone]   Fax:  [phone]  Occupational Therapy Treatment/ discharge Patient Details  Name: Erica Cooper MRN: [id number] Date of Birth: 06/01/1950 Referring Provider (OT): Dr. Jeanie Cooks   Encounter Date: 10/15/2020    OCCUPATIONAL THERAPY DISCHARGE SUMMARY    Current functional level related to goals / functional outcomes: Pt good overall progress, meeting all except 1 goal due to severity of deficits and length of time since onset.   Remaining deficits: Decreased strength, decreased coordination, decreased LUE functional use, decreased balance.   Education / Equipment: Pt was instructed in adapted strategies for ADLs, HEP and splint wear, care and precautions. Pt verbalized understanding of all education. Pt is d/cing at this time to conserve therapy visits for later in the calendar year. Plan: Patient agrees to discharge.  Patient goals were not met. Patient is being discharged due to being pleased with the current functional level.  ?????       OT End of Session - 10/16/20 1004    Visit Number 9    Number of Visits 9    Date for OT Re-Evaluation 10/29/20    Authorization Type [employer]    Authorization Time Period 8 visits through 10/16/20    Authorization - Visit Number 8    Authorization - Number of Visits 8           Past Medical History:  Diagnosis Date  . Diabetes ([place])    borderline/ no meds  . GERD (gastroesophageal reflux disease)   . GI bleeding 09/2014   after colon polypectomy  . Hx of adenomatous colonic polyps 10/07/2014  . Hyperlipidemia   . Hypertension   . Obesity   . Stroke [facility]) 2007   weakness left side    Past Surgical History:  Procedure Laterality Date  . COLONOSCOPY    . COLONOSCOPY N/A 10/08/2014   Procedure: COLONOSCOPY;  Surgeon: Gatha Mayer, MD;  Location: [facility];  Service:  Endoscopy;  Laterality: N/A;    There were no vitals filed for this visit.   Subjective Assessment - 10/15/20 0925    Subjective  Pt reports she feels good about eveything    Pertinent History CVA in 2007    Patient Stated Goals I want to get my hand work    Currently in Pain? No/denies                 Treatment:shoulder flexion in supine and elbow flexion/ extension, supination/ pronation in sidelying, min v.c/ facilitation Weightbearing edge of mat, min vc./ facilitation, then pt returned demonstration Checked progress towards goals. Pt reports cooking and performing light home management. Pt was educated regarding rocker knife and chopper for cutting.               OT Education - 10/16/20 1001    Education Details updates to HEP, see pt instructions. Pt was provided with information regarding an enclosed chopper for safety with cutting. Pt practiced using a rocker knife with RUE, she reports she has something like this at home. Reveiwed application of thumb CMC splint . Checked/ Reviewed progress towards goals. Pt to be d/c in order to conserve visits for later in the year prn.    Person(s) Educated Patient;Spouse    Methods Explanation;Demonstration;Verbal cues;Handout;Tactile cues    Comprehension Verbalized understanding;Returned demonstration;Verbal cues required            OT Short Term Goals - 10/15/20  [place] #1   Title I with HEP    Baseline dependent    Time 4    Period Weeks    Status Achieved    Target Date 10/01/20      OT SHORT TERM GOAL #2   Title Pt will be modified I with donning shirt, pants,    Baseline min A    Time 4    Period Weeks    Status Achieved      OT SHORT TERM GOAL #3   Title Pt will perform basic home management modified I    Baseline requires assistance    Time 4    Period Weeks    Status Achieved   washes dishes     OT SHORT TERM GOAL #4   Title Pt will perform simple cooking modified I     Baseline requires assistance    Time 4    Period Weeks    Status Achieved   met per pt report     OT SHORT TERM GOAL #5   Title Pt will use LUE consistently for ADLs/IADLS as a non dominant assist at least 25% of the time    Baseline does not use LUE consistently    Time 4    Period Weeks    Status Not Met   less than 10%            OT Long Term Goals - 10/15/20 0933      OT LONG TERM GOAL #1   Title Independent w/ updated  HEP for LUE    Status Achieved      OT LONG TERM GOAL #2   Title Pt will verbalize understanding of adaped strategies/ AE to increase safety and I with ADLS/IADLS (such as cutting food)    Status Achieved      OT LONG TERM GOAL #3   Title Independent with splint wear and care L hand for improved positioning prn    Status Achieved      OT LONG TERM GOAL #4   Title Improve LUE function as evidenced by performing 6 blocks or more on Box & Blocks test    Status Achieved   10                Plan - 10/16/20 1004    Clinical Impression Statement Pt demeonstrates good overall progress towards goals. Pt agrees with plans to d/c today in order to conserve visits for later in the year prn.    OT Occupational Profile and History Problem Focused Assessment - Including review of records relating to presenting problem    Occupational performance deficits (Please refer to evaluation for details): ADL's;IADL's;Leisure;Social Participation    Body Structure / Function / Physical Skills ADL;UE functional use;Balance;Flexibility;FMC;ROM;GMC;Coordination;Decreased knowledge of precautions;IADL;Decreased knowledge of use of DME;Dexterity;Strength;Tone;Mobility    Rehab Potential Good    OT Frequency 1x / week    OT Duration 8 weeks    OT Treatment/Interventions Self-care/ADL training;Visual/perceptual remediation/compensation;Patient/family education;Balance training;Cognitive remediation/compensation;Neuromuscular education;Therapeutic activities;Manual  Therapy;Therapeutic exercise;Moist Heat;Splinting;Functional Mobility Training;Electrical Stimulation;Stair Training;Fluidtherapy;Cryotherapy;Passive range of motion;Paraffin;Aquatic Therapy;DME and/or AE instruction;Ultrasound    Plan d/c OT, pt may benefit from additional OT later in the year    Consulted and Agree with Plan of Care Patient;Family member/caregiver    Family Member Consulted husband           Patient will benefit from skilled therapeutic intervention in order to improve the following deficits and  impairments:   Body Structure / Function / Physical Skills: ADL,UE functional use,Balance,Flexibility,FMC,ROM,GMC,Coordination,Decreased knowledge of precautions,IADL,Decreased knowledge of use of DME,Dexterity,Strength,Tone,Mobility       Visit Diagnosis: Hemiplegia and hemiparesis following cerebral infarction affecting left non-dominant side (HCC)  Muscle weakness (generalized)  Other lack of coordination  Other abnormalities of gait and mobility    Problem List Patient Active Problem List   Diagnosis Date Noted  . Hx of adenomatous colonic polyps 10/07/2014  . Dyspnea 08/27/2013  . Abnormal ECG 08/27/2013  . Stroke ([place])   . Hypertension   . Diabetes ([place])   . Obesity   . Hyperlipidemia     Erica Cooper 10/16/2020, 10:06 AM Erica Cooper, OTR/L [phone] Phone: [phone] 10:13 AM 10/16/20 [facility] [address] [place], [place], [address] Phone: [phone]   Fax:  [phone]  Name: Erica Cooper MRN: [id number] Date of Birth: Feb 01, 1951

## 2020-10-29 ENCOUNTER — Ambulatory Visit: Payer: Medicaid Other | Attending: Internal Medicine

## 2020-10-29 ENCOUNTER — Other Ambulatory Visit: Payer: Self-pay

## 2020-10-29 DIAGNOSIS — R2689 Other abnormalities of gait and mobility: Secondary | ICD-10-CM | POA: Diagnosis present

## 2020-10-29 DIAGNOSIS — M6281 Muscle weakness (generalized): Secondary | ICD-10-CM | POA: Diagnosis present

## 2020-10-29 DIAGNOSIS — I69354 Hemiplegia and hemiparesis following cerebral infarction affecting left non-dominant side: Secondary | ICD-10-CM | POA: Diagnosis present

## 2020-10-29 NOTE — Therapy (Signed)
[facility] [address] [place], [place], [address] Phone: [phone]   Fax:  [phone]  Physical Therapy Treatment  Patient Details  Name: Erica Cooper MRN: [id number] Date of Birth: Mar 21, 1951 Referring Provider (PT): Nolene Ebbs   Encounter Date: 10/29/2020   PT End of Session - 10/29/20 0937    Visit Number 8    Number of Visits 9    Authorization Type [employer] medicaid 6/2-6/30    Authorization - Visit Number 1    Authorization - Number of Visits 4    PT Start Time 0935    PT Stop Time 1019    PT Time Calculation (min) 44 min    Equipment Utilized During Treatment Gait belt    Activity Tolerance Patient tolerated treatment well           Past Medical History:  Diagnosis Date  . Diabetes ([place])    borderline/ no meds  . GERD (gastroesophageal reflux disease)   . GI bleeding 09/2014   after colon polypectomy  . Hx of adenomatous colonic polyps 10/07/2014  . Hyperlipidemia   . Hypertension   . Obesity   . Stroke [facility]) 2007   weakness left side    Past Surgical History:  Procedure Laterality Date  . COLONOSCOPY    . COLONOSCOPY N/A 10/08/2014   Procedure: COLONOSCOPY;  Surgeon: Gatha Mayer, MD;  Location: [facility];  Service: Endoscopy;  Laterality: N/A;    There were no vitals filed for this visit.   Subjective Assessment - 10/29/20 0937    Subjective Pt reports back is doing better. She went to get her AFO on Tuesday but was told they were still awaiting insurance approval.    Patient is accompained by: Family member   husband and son   Patient Stated Goals Pt would like to get stronger to walk better    Currently in Pain? No/denies    Pain Onset 1 to 4 weeks ago                             [facility] Adult PT Treatment/Exercise - 10/29/20 1009      Transfers   Transfers Sit to Stand;Stand to Sit    Sit to Stand 6: Modified independent (Device/Increase time)     Stand to Sit 6: Modified independent (Device/Increase time)      Ambulation/Gait   Ambulation/Gait Yes    Ambulation/Gait Assistance 6: Modified independent (Device/Increase time);4: Min guard    Ambulation/Gait Assistance Details Pt was mod I with gait with CGA just to help with some facilitation for left weight shift. Pt was cued to try to increase left stance time and slow down/extend right step. Pt had improved left stance time after NMR activities.    Ambulation Distance (Feet) 200 Feet   115' x 1 after NMR activities in // bars   Assistive device Straight cane   left posterior [place] AFO   Gait Pattern Step-through pattern;Decreased stance time - left;Decreased step length - right;Decreased step length - left;Decreased hip/knee flexion - left;Decreased weight shift to left    Ambulation Surface Level;Indoor      Neuro Re-ed    Neuro Re-ed Details  In // bars: standing on rockerboard positioned lateral: maintaining level 30 sec x 2 without UE, weight shifting with light 1 UE support x 10 then x 10 without CGA with tactile, verbal and visual cues from mirror for erect posture  with weight shift and to go slowly. Step-ups on 4" step with LLE x 10 with no UE support then x 10 with cane support CGA for weight shift and visual cues in mirror.                    PT Short Term Goals - 09/03/20 1314      PT SHORT TERM GOAL #1   Title Pt will be independent with initial HEP for strengthening, flexibility and balance.    Baseline 09/03/20 pt reports working on current HEP and denies any questions.    Time 4    Period Weeks    Status Achieved    Target Date 08/29/20      PT SHORT TERM GOAL #2   Title Pt will decrease TUG form 19.07 sec to <16 sec for improved balance and functional mobility.    Baseline 07/29/20 19.07 sec. 09/03/20 26 sec    Time 4    Period Weeks    Status Not Met    Target Date 08/29/20      PT SHORT TERM GOAL #3   Title Pt will ambulate >300' on level surfaces  without AD independently for improved household and short community distances.    Baseline 07/29/20 100' CGA with decreased left foot clearance. 09/03/20 345' supervision but more due to working on gait quality.    Time 4    Period Weeks    Status Partially Met    Target Date 08/29/20      PT SHORT TERM GOAL #4   Title Berg Balance will be assessed to further assess balance.    Baseline Berg performed 08/13/20 46/56    Time 4    Period Weeks    Status Achieved    Target Date 08/29/20             PT Long Term Goals - 09/24/20 0904      PT LONG TERM GOAL #1   Title Pt will be independent with progressive HEP for strength, flexibility and balance to continue gains on own.    Baseline 09/24/20 Pt has been given initial HEP but has not been working on recently due to the pain she has developed in her back.    Time 8    Period Weeks    Status On-going      PT LONG TERM GOAL #2   Title Pt will ambulate >600' on varied surfaces with LRAD versus no device mod I for improved community mobility.    Baseline 09/24/20 600' with cane and left AFO supervision/mod I on level surfaces    Time 8    Period Weeks    Status Partially Met      PT LONG TERM GOAL #3   Title Pt will increase gait speed from 0.29ms to >0.673m for improved mobility.    Baseline 07/29/20 0.4653mwithout AD. 09/24/20 0.83m64m  Time 8    Period Weeks    Status Not Met      PT LONG TERM GOAL #4   Title Pt will decrease 5 x sit to stand from 30.79 sec to <22 sec for improved balance and functional strength.    Baseline 07/29/20 30.79 sec from chair without hands. 09/24/20 34 sec from chair without hands. Pt having back pain which is slowing her.    Time 8    Period Weeks    Status Not Met      PT LONG TERM GOAL #5  Title Pt will increase Berg from 46/56 to >49/56 for improved balance.    Baseline 08/13/20 46/56. 09/24/20 47/56    Time 8    Period Weeks    Status On-going                 Plan - 10/29/20 1030     Clinical Impression Statement Pt has still not received AFO as pending insurance approval. PT called [facility] to verify this. Utilized clinic left posterior [place] AFO that should be the same one she is getting. Session focused on trying to increase left stance time with AFO donned. Pt was able to show carryover after activities at end.    Personal Factors and Comorbidities Comorbidity 3+;Time since onset of injury/illness/exacerbation    Comorbidities DM, stroke 2007, HTN, OA    Examination-Activity Limitations Stairs;Locomotion Level;Stand;Transfers;Dressing    Examination-Participation Restrictions Community Activity;Cleaning;Laundry    Stability/Clinical Decision Making Evolving/Moderate complexity    Rehab Potential Good    PT Frequency 1x / week   plus eval   PT Duration 8 weeks    PT Treatment/Interventions ADLs/Self Care Home Management;DME Instruction;Therapeutic activities;Functional mobility training;Gait training;Stair training;Therapeutic exercise;Balance training;Neuromuscular re-education;Orthotic Fit/Training;Patient/family education;Manual techniques;Passive range of motion;Vestibular    PT Next Visit Plan Had pt schedule to return in 2 weeks as [employer] associate said the insurance auth takes 9-14 days and was submitted on 5/42/22. Recert next visit for 1x/week for 2 weeks to get through end of Medicaid auth throught 6/30 to practice with her new AFO. Gait with new AFO and cane. Needs more hip/knee flexion to help clear foot.Continue NMR for left weight shift and hip strengthening. Continue with tall kneeling activities.    Consulted and Agree with Plan of Care Patient;Family member/caregiver    Family Member Consulted husband           Patient will benefit from skilled therapeutic intervention in order to improve the following deficits and impairments:  Abnormal gait,Decreased activity tolerance,Decreased balance,Decreased endurance,Decreased knowledge of use of DME,Decreased  strength,Impaired tone,Decreased mobility,Decreased range of motion,Impaired flexibility  Visit Diagnosis: Other abnormalities of gait and mobility  Hemiplegia and hemiparesis following cerebral infarction affecting left non-dominant side [facility])     Problem List Patient Active Problem List   Diagnosis Date Noted  . Hx of adenomatous colonic polyps 10/07/2014  . Dyspnea 08/27/2013  . Abnormal ECG 08/27/2013  . Stroke ([place])   . Hypertension   . Diabetes ([place])   . Obesity   . Hyperlipidemia     Electa Sniff, PT, DPT, NCS 10/29/2020, 10:34 AM  [facility] [address] [place], [place], [address] Phone: [phone]   Fax:  [phone]  Name: Erica Cooper MRN: [id number] Date of Birth: 07-24-1950

## 2020-11-12 ENCOUNTER — Ambulatory Visit: Payer: Medicaid Other | Admitting: Physical Therapy

## 2020-11-12 ENCOUNTER — Other Ambulatory Visit: Payer: Self-pay

## 2020-11-12 DIAGNOSIS — R2689 Other abnormalities of gait and mobility: Secondary | ICD-10-CM

## 2020-11-12 DIAGNOSIS — I69354 Hemiplegia and hemiparesis following cerebral infarction affecting left non-dominant side: Secondary | ICD-10-CM

## 2020-11-12 DIAGNOSIS — M6281 Muscle weakness (generalized): Secondary | ICD-10-CM

## 2020-11-12 NOTE — Therapy (Signed)
[facility] [address] [place], [place], [address] Phone: [phone]   Fax:  [phone]  Physical Therapy Treatment  Patient Details  Name: Erica Cooper MRN: [id number] Date of Birth: 1950-07-15 Referring Provider (PT): Dr. Nolene Ebbs   Encounter Date: 11/12/2020   PT End of Session - 11/12/20 1941     Visit Number 9    Number of Visits 9    Date for PT Re-Evaluation 11/26/20    Authorization Type [employer] medicaid 6/2-6/30    Authorization - Visit Number 2    Authorization - Number of Visits 4    PT Start Time 0809    PT Stop Time 0845    PT Time Calculation (min) 36 min    Activity Tolerance Patient tolerated treatment well    Behavior During Therapy [facility] for tasks assessed/performed             Past Medical History:  Diagnosis Date   Diabetes ([place])    borderline/ no meds   GERD (gastroesophageal reflux disease)    GI bleeding 09/2014   after colon polypectomy   Hx of adenomatous colonic polyps 10/07/2014   Hyperlipidemia    Hypertension    Obesity    Stroke [facility]) 2007   weakness left side    Past Surgical History:  Procedure Laterality Date   COLONOSCOPY     COLONOSCOPY N/A 10/08/2014   Procedure: COLONOSCOPY;  Surgeon: Gatha Mayer, MD;  Location: [facility];  Service: Endoscopy;  Laterality: N/A;    There were no vitals filed for this visit.   Subjective Assessment - 11/12/20 1931     Subjective Patient received AFO last week but today is her first day wearing it.  No discomfort; had a hard time getting it on this morning    Patient is accompained by: Family member   husband and son   Patient Stated Goals Pt would like to get stronger to walk better    Currently in Pain? No/denies    Pain Onset 1 to 4 weeks ago                [facility] PT Assessment - 11/12/20 0829       Assessment   Medical Diagnosis CVA    Referring Provider (PT) Dr. Nolene Ebbs    Onset  Date/Surgical Date 07/30/20    Hand Dominance Right    Prior Therapy in years past she had a little      Precautions   Precautions Fall    Required Braces or Orthoses Other Brace/Splint    Other Brace/Splint New AFO LLE      Prior Function   Level of Independence Independent with household mobility with device;Needs assistance with ADLs    Vocation Retired      Ambulation/Gait   Ambulation/Gait Yes    Ambulation/Gait Assistance 6: Modified independent (Device/Increase time);4: Min guard    Ambulation/Gait Assistance Details assessed gait with new AFO without and then with heel wedge; no cane.  Pt continues to demonstrate hip hike in order to initiate and advance LLE and decreased stance time on LLE.    Ambulation Distance (Feet) 230 Feet    Assistive device None    Gait Pattern Step-through pattern;Decreased stance time - left;Decreased step length - left;Decreased hip/knee flexion - left;Decreased weight shift to left;Decreased trunk rotation;Wide base of support    Ambulation Surface Level;Indoor    Gait velocity 21.09 seconds      Standardized Balance  Assessment   Standardized Balance Assessment Berg Balance Test;10 meter walk test;Five Times Sit to Stand    Five times sit to stand comments  29.10 seconds from chair with new AFO, no UE support    10 Meter Walk 21.09 seconds with new AFO or .47 m/sec      Berg Balance Test   Sit to Stand Able to stand without using hands and stabilize independently    Standing Unsupported Able to stand safely 2 minutes    Sitting with Back Unsupported but Feet Supported on Floor or Stool Able to sit safely and securely 2 minutes    Stand to Sit Sits safely with minimal use of hands    Transfers Able to transfer safely, minor use of hands    Standing Unsupported with Eyes Closed Able to stand 10 seconds safely    Standing Unsupported with Feet Together Able to place feet together independently and stand for 1 minute with supervision    From  Standing, Reach Forward with Outstretched Arm Can reach confidently >25 cm (10")    From Standing Position, Pick up Object from Floor Able to pick up shoe safely and easily    From Standing Position, Turn to Look Behind Over each Shoulder Looks behind one side only/other side shows less weight shift    Turn 360 Degrees Able to turn 360 degrees safely but slowly    Standing Unsupported, Alternately Place Feet on Step/Stool Able to complete >2 steps/needs minimal assist    Standing Unsupported, One Foot in Front Able to plae foot ahead of the other independently and hold 30 seconds    Standing on One Leg Tries to lift leg/unable to hold 3 seconds but remains standing independently    Total Score 45    Berg comment: 45/56                           [facility] Adult PT Treatment/Exercise - 11/12/20 0829       Therapeutic Activites    Therapeutic Activities ADL's    ADL's Problem solved best way for pt to don AFO.  Pt unable to cross LLE over R and hold LE in place to don AFO and shoe.  Placed shoe down with AFO lying down on ground - pt placed foot halfway in shoe and then turned AFO upright and pulled up on the AFO as she pushed her foot down.  Min A to loosen laces, tie laces and cues for how to loop strap through D ring.                    PT Education - 11/12/20 1940     Education Details use of heel wedge to provide more firm support under AFO and prevent tipping of AFO; donning AFO, plan for final two visits    Person(s) Educated Patient;Spouse    Methods Explanation;Demonstration    Comprehension Verbalized understanding;Returned demonstration              PT Short Term Goals - 09/03/20 1314       PT SHORT TERM GOAL #1   Title Pt will be independent with initial HEP for strengthening, flexibility and balance.    Baseline 09/03/20 pt reports working on current HEP and denies any questions.    Time 4    Period Weeks    Status Achieved    Target Date  08/29/20      PT SHORT TERM  GOAL #2   Title Pt will decrease TUG form 19.07 sec to <16 sec for improved balance and functional mobility.    Baseline 07/29/20 19.07 sec. 09/03/20 26 sec    Time 4    Period Weeks    Status Not Met    Target Date 08/29/20      PT SHORT TERM GOAL #3   Title Pt will ambulate >300' on level surfaces without AD independently for improved household and short community distances.    Baseline 07/29/20 100' CGA with decreased left foot clearance. 09/03/20 345' supervision but more due to working on gait quality.    Time 4    Period Weeks    Status Partially Met    Target Date 08/29/20      PT SHORT TERM GOAL #4   Title Berg Balance will be assessed to further assess balance.    Baseline Berg performed 08/13/20 46/56    Time 4    Period Weeks    Status Achieved    Target Date 08/29/20               PT Long Term Goals - 11/12/20 0828       PT LONG TERM GOAL #1   Title Pt will be independent with progressive HEP for strength, flexibility and balance to continue gains on own.    Baseline Pt needs upgraded HEP to focus on LLE flexion and balance    Time 8    Period Weeks    Status Not Met      PT LONG TERM GOAL #2   Title Pt will ambulate >600' on varied surfaces with LRAD versus no device mod I for improved community mobility.    Baseline 230' over indoor surfaces without cane but with new AFO, MOD I    Time 8    Period Weeks    Status Not Met      PT LONG TERM GOAL #3   Title Pt will increase gait speed from 0.63m/s to >0.105m/s for improved mobility.    Baseline .46 m/sec    Time 8    Period Weeks    Status Not Met      PT LONG TERM GOAL #4   Title Pt will decrease 5 x sit to stand from 30.79 sec to <22 sec for improved balance and functional strength.    Baseline 29 seconds from mat, no UE support    Time 8    Period Weeks    Status Not Met      PT LONG TERM GOAL #5   Title Pt will increase Berg from 46/56 to >49/56 for improved balance.     Baseline 46/56    Time 8    Period Weeks    Status Not Met             Goals for recertification:   PT Long Term Goals - 11/12/20 1954       PT LONG TERM GOAL #1   Title Pt will be independent with progressive HEP for strength, flexibility and balance to continue gains on own.    Baseline Pt needs upgraded HEP to focus on LLE flexion and balance    Time 2    Period Weeks    Status Revised    Target Date 11/26/20      PT LONG TERM GOAL #2   Title Pt will ambulate >500' on varied surfaces with LRAD versus no device mod I for improved community mobility.  Baseline 230' over indoor surfaces without cane but with new AFO, MOD I    Time 2    Period Weeks    Status Revised    Target Date 11/26/20      PT LONG TERM GOAL #3   Title Pt will increase gait speed from 0.89m/s to >0.33m/s with new AFO for improved mobility.    Baseline .46 m/sec    Time 2    Period Weeks    Status Revised    Target Date 11/26/20      PT LONG TERM GOAL #4   Title Pt will decrease 5 x sit to stand to <25 sec for improved balance and functional strength.    Baseline 29 seconds from mat, no UE support    Time 2    Period Weeks    Status Revised    Target Date 11/26/20      PT LONG TERM GOAL #5   Title Pt will increase Berg to >49/56 for improved balance.    Baseline 46/56    Time 2    Period Weeks    Status Revised    Target Date 11/26/20                  Plan - 11/12/20 1946     Clinical Impression Statement Pt received new AFO; participated in education and training for how to don AFO, addition of heel wedge for firm support under AFO and gait assessment indoors with AFO and no AD.  Performed assessment of progress towards LTG; pt has met 0/5 LTG but did demonstrate improvement in time to perform five time sit to stand.  Pt's gait velocity and BERG remained the same.  Pt would benefit from two more PT sessions to continue to address gait and balance impairments, to provide ongoing  AFO education and to provide pt with comprehesive HEP prior to D/C.    Personal Factors and Comorbidities Comorbidity 3+;Time since onset of injury/illness/exacerbation    Comorbidities DM, stroke 2007, HTN, OA    Examination-Activity Limitations Stairs;Locomotion Level;Stand;Transfers;Dressing    Examination-Participation Restrictions Community Activity;Cleaning;Laundry    Stability/Clinical Decision Making Evolving/Moderate complexity    Rehab Potential Good    PT Frequency 1x / week   plus eval   PT Duration 2 weeks    PT Treatment/Interventions ADLs/Self Care Home Management;DME Instruction;Therapeutic activities;Functional mobility training;Gait training;Stair training;Therapeutic exercise;Balance training;Neuromuscular re-education;Orthotic Fit/Training;Patient/family education;Manual techniques;Passive range of motion;Vestibular    PT Next Visit Plan Please update and progress HEP focus on LLE hip and knee flexion strength, glute med strength LLE, standing balance -SLS, narrow BOS.  2nd visit: re-check goals and send to me for D/C    Consulted and Agree with Plan of Care Patient;Family member/caregiver    Family Member Consulted husband             Patient will benefit from skilled therapeutic intervention in order to improve the following deficits and impairments:  Abnormal gait, Decreased activity tolerance, Decreased balance, Decreased endurance, Decreased knowledge of use of DME, Decreased strength, Impaired tone, Decreased mobility, Decreased range of motion, Impaired flexibility  Visit Diagnosis: Other abnormalities of gait and mobility  Hemiplegia and hemiparesis following cerebral infarction affecting left non-dominant side ([facility])  Muscle weakness (generalized)     Problem List Patient Active Problem List   Diagnosis Date Noted   Hx of adenomatous colonic polyps 10/07/2014   Dyspnea 08/27/2013   Abnormal ECG 08/27/2013   Stroke ([facility])    Hypertension  Diabetes  ([place])    Obesity    Hyperlipidemia     Rico Junker 11/12/2020, 7:53 PM  [facility] [address] [place], [place], [address] Phone: [phone]   Fax:  [phone]  Name: Erica Cooper MRN: [id number] Date of Birth: February 18, 1951

## 2020-11-19 ENCOUNTER — Encounter: Payer: Self-pay | Admitting: Physical Therapy

## 2020-11-19 ENCOUNTER — Other Ambulatory Visit: Payer: Self-pay

## 2020-11-19 ENCOUNTER — Ambulatory Visit: Payer: Medicaid Other | Admitting: Physical Therapy

## 2020-11-19 DIAGNOSIS — R2689 Other abnormalities of gait and mobility: Secondary | ICD-10-CM

## 2020-11-19 DIAGNOSIS — I69354 Hemiplegia and hemiparesis following cerebral infarction affecting left non-dominant side: Secondary | ICD-10-CM

## 2020-11-19 DIAGNOSIS — M6281 Muscle weakness (generalized): Secondary | ICD-10-CM

## 2020-11-19 NOTE — Patient Instructions (Signed)
Access Code: [email or web address] URL: [email or web address] Date: 11/19/2020 Prepared by: Willow Ora  Exercises Supine Heel Bridge with Arms Across Chest - 1 x daily - 5 x weekly - 1 sets - 10 reps Seated Heel Slide - 2 x daily - 5 x weekly - 2 sets - 10 reps Hooklying Single Leg Bent Knee Fallouts with Resistance - 1 x daily - 5 x weekly - 1 sets - 10 reps Standing Single Leg Stance with Counter Support - 1 x daily - 5 x weekly - 1 sets - 3 reps - 15 hold Standing Near Stance in Corner with Eyes Closed - 1 x daily - 5 x weekly - 1 sets - 3 reps - 30 hold Standing Balance in Corner with Eyes Closed - 1 x daily - 5 x weekly - 1 sets - 10 reps

## 2020-11-20 NOTE — Therapy (Signed)
[facility] [address] [place], [place], [address] Phone: [phone]   Fax:  [phone]  Physical Therapy Treatment  Patient Details  Name: Erica Cooper MRN: [id number] Date of Birth: 1950/10/18 Referring Provider (PT): Dr. Nolene Ebbs   Encounter Date: 11/19/2020   PT End of Session - 11/19/20 0940     Visit Number 10    Number of Visits 11    Date for PT Re-Evaluation 11/26/20    Authorization Type [employer] medicaid 6/2-6/30    Authorization - Visit Number 3    Authorization - Number of Visits 4    PT Start Time 7412    PT Stop Time 1015    PT Time Calculation (min) 40 min    Equipment Utilized During Treatment Gait belt    Activity Tolerance Patient tolerated treatment well    Behavior During Therapy [facility] for tasks assessed/performed             Past Medical History:  Diagnosis Date   Diabetes ([place])    borderline/ no meds   GERD (gastroesophageal reflux disease)    GI bleeding 09/2014   after colon polypectomy   Hx of adenomatous colonic polyps 10/07/2014   Hyperlipidemia    Hypertension    Obesity    Stroke [facility]) 2007   weakness left side    Past Surgical History:  Procedure Laterality Date   COLONOSCOPY     COLONOSCOPY N/A 10/08/2014   Procedure: COLONOSCOPY;  Surgeon: Gatha Mayer, MD;  Location: [facility];  Service: Endoscopy;  Laterality: N/A;    There were no vitals filed for this visit.   Subjective Assessment - 11/19/20 0939     Subjective No new complaints. Arrives carrying brace in bag, has only worn it twice since last here. No pain or falls to report.    Currently in Pain? No/denies    Pain Score 0-No pain                 [facility] Adult PT Treatment/Exercise - 11/19/20 0940       Transfers   Transfers Sit to Stand;Stand to Sit    Sit to Stand 6: Modified independent (Device/Increase time)    Stand to Sit 6: Modified independent (Device/Increase time)       Ambulation/Gait   Ambulation/Gait Yes    Ambulation/Gait Assistance 6: Modified independent (Device/Increase time)    Ambulation/Gait Assistance Details assisted pt with donning her AFO for session. Discussed she should be wearing the brace daily when she is going to be up and moving for increased safety with gait/decreased fall risk. Pt verbalized understanding    Ambulation Distance (Feet) 115 Feet   x1, plus around clinic with session   Assistive device None    Gait Pattern Step-through pattern;Decreased stance time - left;Decreased step length - left;Decreased hip/knee flexion - left;Decreased weight shift to left;Decreased trunk rotation;Wide base of support    Ambulation Surface Level;Indoor      Exercises   Exercises Other Exercises    Other Exercises  reviewed and advanced pt's HEP this session. Refer to [place] for full details. Min guard assist for safety. Cues on correct form and technique.             issued to HEP today: Access Code: [email or web address] URL: [email or web address] Date: 11/19/2020 Prepared by: Willow Ora   Exercises Supine Heel Bridge with Arms Across Chest - 1 x daily - 5 x weekly - 1 sets - 10 reps  Seated Heel Slide - 2 x daily - 5 x weekly - 2 sets - 10 reps Hooklying Single Leg Bent Knee Fallouts with Resistance - 1 x daily - 5 x weekly - 1 sets - 10 reps Standing Single Leg Stance with Counter Support - 1 x daily - 5 x weekly - 1 sets - 3 reps - 15 hold Standing Near Stance in Corner with Eyes Closed - 1 x daily - 5 x weekly - 1 sets - 3 reps - 30 hold Standing Balance in Corner with Eyes Closed - 1 x daily - 5 x weekly - 1 sets - 10 reps         PT Short Term Goals - 11/12/20 1954       PT SHORT TERM GOAL #1   Title = LTG               PT Long Term Goals - 11/12/20 1954       PT LONG TERM GOAL #1   Title Pt will be independent with progressive HEP for strength, flexibility and balance to continue gains on own.     Baseline Pt needs upgraded HEP to focus on LLE flexion and balance    Time 2    Period Weeks    Status Revised    Target Date 11/26/20      PT LONG TERM GOAL #2   Title Pt will ambulate >500' on varied surfaces with LRAD versus no device mod I for improved community mobility.    Baseline 230' over indoor surfaces without cane but with new AFO, MOD I    Time 2    Period Weeks    Status Revised    Target Date 11/26/20      PT LONG TERM GOAL #3   Title Pt will increase gait speed from 0.42m/s to >0.47m/s with new AFO for improved mobility.    Baseline .46 m/sec    Time 2    Period Weeks    Status Revised    Target Date 11/26/20      PT LONG TERM GOAL #4   Title Pt will decrease 5 x sit to stand to <25 sec for improved balance and functional strength.    Baseline 29 seconds from mat, no UE support    Time 2    Period Weeks    Status Revised    Target Date 11/26/20      PT LONG TERM GOAL #5   Title Pt will increase Berg to >49/56 for improved balance.    Baseline 46/56    Time 2    Period Weeks    Status Revised    Target Date 11/26/20                   Plan - 11/19/20 0940     Clinical Impression Statement Today's skilled session continued to focus on use of brace with gait and education that pt should be wearing the brace with mobility. Remainder of session focused on updating/advancing pt's HEP for strenthening and balance with no issues noted or reported in session.    Personal Factors and Comorbidities Comorbidity 3+;Time since onset of injury/illness/exacerbation    Comorbidities DM, stroke 2007, HTN, OA    Examination-Activity Limitations Stairs;Locomotion Level;Stand;Transfers;Dressing    Examination-Participation Restrictions Community Activity;Cleaning;Laundry    Stability/Clinical Decision Making Evolving/Moderate complexity    Rehab Potential Good    PT Frequency 1x / week   plus eval   PT Duration  2 weeks    PT Treatment/Interventions ADLs/Self Care  Home Management;DME Instruction;Therapeutic activities;Functional mobility training;Gait training;Stair training;Therapeutic exercise;Balance training;Neuromuscular re-education;Orthotic Fit/Training;Patient/family education;Manual techniques;Passive range of motion;Vestibular    PT Next Visit Plan re-check goals and send to me Letta Moynahan)  for D/C    Consulted and Agree with Plan of Care Patient;Family member/caregiver    Family Member Consulted husband             Patient will benefit from skilled therapeutic intervention in order to improve the following deficits and impairments:  Abnormal gait, Decreased activity tolerance, Decreased balance, Decreased endurance, Decreased knowledge of use of DME, Decreased strength, Impaired tone, Decreased mobility, Decreased range of motion, Impaired flexibility  Visit Diagnosis: Other abnormalities of gait and mobility  Hemiplegia and hemiparesis following cerebral infarction affecting left non-dominant side (HCC)  Muscle weakness (generalized)     Problem List Patient Active Problem List   Diagnosis Date Noted   Hx of adenomatous colonic polyps 10/07/2014   Dyspnea 08/27/2013   Abnormal ECG 08/27/2013   Stroke ([place])    Hypertension    Diabetes ([place])    Obesity    Hyperlipidemia     Willow Ora, PTA, [facility] [address] [place], [place] [address] [phone] 11/20/20, 4:59 PM   Name: Erica Cooper MRN: [id number] Date of Birth: 08/17/1950

## 2020-11-26 ENCOUNTER — Other Ambulatory Visit: Payer: Self-pay

## 2020-11-26 ENCOUNTER — Ambulatory Visit: Payer: Medicaid Other | Admitting: Physical Therapy

## 2020-11-26 DIAGNOSIS — I69354 Hemiplegia and hemiparesis following cerebral infarction affecting left non-dominant side: Secondary | ICD-10-CM

## 2020-11-26 DIAGNOSIS — R2689 Other abnormalities of gait and mobility: Secondary | ICD-10-CM

## 2020-11-26 DIAGNOSIS — M6281 Muscle weakness (generalized): Secondary | ICD-10-CM

## 2020-11-26 NOTE — Therapy (Addendum)
[facility] [address] [place], [place], [address] Phone: [phone]   Fax:  [phone]  Physical Therapy Treatment and D/C Summary  Patient Details  Name: Erica Cooper MRN: [id number] Date of Birth: 06-25-1950 Referring Provider (PT): Dr. Nolene Ebbs   Encounter Date: 11/26/2020   PT End of Session - 11/26/20 0939     Visit Number 11    Number of Visits 11    Date for PT Re-Evaluation 11/26/20    Authorization Type [employer] medicaid 6/2-6/30    Authorization - Visit Number 4    Authorization - Number of Visits 4    PT Start Time 8127    PT Stop Time 1014    PT Time Calculation (min) 40 min    Equipment Utilized During Treatment Gait belt    Activity Tolerance Patient tolerated treatment well    Behavior During Therapy [facility] for tasks assessed/performed             Past Medical History:  Diagnosis Date   Diabetes ([place])    borderline/ no meds   GERD (gastroesophageal reflux disease)    GI bleeding 09/2014   after colon polypectomy   Hx of adenomatous colonic polyps 10/07/2014   Hyperlipidemia    Hypertension    Obesity    Stroke [facility]) 2007   weakness left side    Past Surgical History:  Procedure Laterality Date   COLONOSCOPY     COLONOSCOPY N/A 10/08/2014   Procedure: COLONOSCOPY;  Surgeon: Gatha Mayer, MD;  Location: [facility];  Service: Endoscopy;  Laterality: N/A;    There were no vitals filed for this visit.   Subjective Assessment - 11/26/20 0938     Subjective No new complaints. Has brace on today. Reports wearing it anytime she goes out of the house. Not wearing it if she is staying inside the house.    Patient is accompained by: Family member   spouse   Patient Stated Goals Pt would like to get stronger to walk better    Currently in Pain? No/denies    Pain Score 0-No pain                [facility] PT Assessment - 11/26/20 0942       Berg Balance Test   Sit to  Stand Able to stand without using hands and stabilize independently    Standing Unsupported Able to stand safely 2 minutes    Sitting with Back Unsupported but Feet Supported on Floor or Stool Able to sit safely and securely 2 minutes    Stand to Sit Sits safely with minimal use of hands    Transfers Able to transfer safely, minor use of hands    Standing Unsupported with Eyes Closed Able to stand 10 seconds safely    Standing Unsupported with Feet Together Able to place feet together independently and stand 1 minute safely    From Standing, Reach Forward with Outstretched Arm Can reach confidently >25 cm (10")    From Standing Position, Pick up Object from Floor Able to pick up shoe safely and easily    From Standing Position, Turn to Look Behind Over each Shoulder Looks behind from both sides and weight shifts well    Turn 360 Degrees Able to turn 360 degrees safely but slowly   5-6 sec's both ways   Standing Unsupported, Alternately Place Feet on Step/Stool Able to stand independently and safely and complete 8 steps in 20 seconds  18.62 sec's   Standing Unsupported, One Foot in Front Able to plae foot ahead of the other independently and hold 30 seconds    Standing on One Leg Tries to lift leg/unable to hold 3 seconds but remains standing independently    Total Score 50    Berg comment: 50/56 46-51 moderate risk of fall                      [facility] Adult PT Treatment/Exercise - 11/26/20 0942       Transfers   Transfers Sit to Stand;Stand to Sit    Sit to Stand 6: Modified independent (Device/Increase time)    Five time sit to stand comments  15.60 sec's no UE support from standard height surface    Stand to Sit 6: Modified independent (Device/Increase time)      Ambulation/Gait   Ambulation/Gait Yes    Ambulation/Gait Assistance 6: Modified independent (Device/Increase time)    Ambulation/Gait Assistance Details wtih brace and cane in/outdoors with no imbalance noted. no  device used in clinic between tasks    Ambulation Distance (Feet) 500 Feet   x1 in/outdoors, plus around clinic with session   Assistive device None;Straight cane    Gait Pattern Step-through pattern;Decreased stance time - left;Decreased step length - left;Decreased hip/knee flexion - left;Decreased weight shift to left;Decreased trunk rotation;Wide base of support    Ambulation Surface Level;Unlevel;Indoor;Outdoor;Paved    Gait velocity 21.09 seconds= 0.47 m/s                    PT Education - 11/26/20 1027     Education Details progress toward goals with plan to discharge this session; copy of HEP after review in session    Person(s) Educated Patient;Spouse    Methods Explanation;Demonstration;Verbal cues;Handout    Comprehension Verbalized understanding;Returned demonstration              PT Short Term Goals - 11/12/20 1954       PT SHORT TERM GOAL #1   Title = LTG               PT Long Term Goals - 11/26/20 0940       PT LONG TERM GOAL #1   Title Pt will be independent with progressive HEP for strength, flexibility and balance to continue gains on own.    Baseline 11/26/20: met with advanced program from last session. Reports not issues with review today    Status Achieved      PT LONG TERM GOAL #2   Title Pt will ambulate >500' on varied surfaces with LRAD versus no device mod I for improved community mobility.    Baseline 11/26/20: met in session today    Time --    Period --    Status Achieved      PT LONG TERM GOAL #3   Title Pt will increase gait speed from 0.21m/s to >0.66m/s with new AFO for improved mobility.    Baseline 11/26/20: 0.47 m/s    Time --    Period --    Status Not Met      PT LONG TERM GOAL #4   Title Pt will decrease 5 x sit to stand to <25 sec for improved balance and functional strength.    Baseline 11/26/20: 15.60 sec's no UE support from standard height surface    Time --    Period --    Status Achieved      PT LONG  TERM GOAL #5   Title Pt will increase Berg to >49/56 for improved balance.    Baseline 10/30/20: 50/56 scored today    Time --    Period --    Status Achieved                   Plan - 11/26/20 0939     Clinical Impression Statement Today's skilled session focused on progress toward LTGs with all goals met except for gait speed. Pt had not change in gait speed from last assessment with score of 0.47 m/s with cane/AFO. Pt did improved her Berg Balance Test score to 50/56 and her 5 time sit to stand score to 15.60 sec's with no UE support. Pt given new copy of ex's as she could not recall where she placed her handouts. Pt and spouse without questions at end of session and agreeable to discharge.    Personal Factors and Comorbidities Comorbidity 3+;Time since onset of injury/illness/exacerbation    Comorbidities DM, stroke 2007, HTN, OA    Examination-Activity Limitations Stairs;Locomotion Level;Stand;Transfers;Dressing    Examination-Participation Restrictions Community Activity;Cleaning;Laundry    Stability/Clinical Decision Making Evolving/Moderate complexity    Rehab Potential Good    PT Frequency 1x / week   plus eval   PT Duration 2 weeks    PT Treatment/Interventions ADLs/Self Care Home Management;DME Instruction;Therapeutic activities;Functional mobility training;Gait training;Stair training;Therapeutic exercise;Balance training;Neuromuscular re-education;Orthotic Fit/Training;Patient/family education;Manual techniques;Passive range of motion;Vestibular    PT Next Visit Plan dicharge per PT plan of care    Consulted and Agree with Plan of Care Patient;Family member/caregiver    Family Member Consulted husband             Patient will benefit from skilled therapeutic intervention in order to improve the following deficits and impairments:  Abnormal gait, Decreased activity tolerance, Decreased balance, Decreased endurance, Decreased knowledge of use of DME, Decreased  strength, Impaired tone, Decreased mobility, Decreased range of motion, Impaired flexibility  Visit Diagnosis: Other abnormalities of gait and mobility  Hemiplegia and hemiparesis following cerebral infarction affecting left non-dominant side (HCC)  Muscle weakness (generalized)     Problem List Patient Active Problem List   Diagnosis Date Noted   Hx of adenomatous colonic polyps 10/07/2014   Dyspnea 08/27/2013   Abnormal ECG 08/27/2013   Stroke ([place])    Hypertension    Diabetes ([place])    Obesity    Hyperlipidemia     Willow Ora, PTA, [facility] [address], [place], [place] [address] [phone] 11/26/20, 10:35 AM    PHYSICAL THERAPY DISCHARGE SUMMARY  Visits from Start of Care: 11  Current functional level related to goals / functional outcomes: 4/5 LTG met; please see LTG achievement and impression statement above.   Remaining deficits: Impaired standing balance, abnormal gait, hemiparesis   Education / Equipment: HEP, new AFO   Patient agrees to discharge. Patient goals were met. Patient is being discharged due to meeting the stated rehab goals.   Rico Junker, PT, DPT 11/26/20    4:08 PM    Name: Erica Cooper MRN: [id number] Date of Birth: [birth year]

## 2020-11-26 NOTE — Patient Instructions (Signed)
Access Code: [email or web address] URL: [email or web address] Date: 11/26/2020 Prepared by: Willow Ora  Exercises Supine Heel Bridge with Arms Across Chest - 1 x daily - 5 x weekly - 1 sets - 10 reps Seated Heel Slide - 2 x daily - 5 x weekly - 2 sets - 10 reps Hooklying Single Leg Bent Knee Fallouts with Resistance - 1 x daily - 5 x weekly - 1 sets - 10 reps Standing Single Leg Stance with Counter Support - 1 x daily - 5 x weekly - 1 sets - 3 reps - 15 hold Standing Near Stance in Corner with Eyes Closed - 1 x daily - 5 x weekly - 1 sets - 3 reps - 30 hold Standing Balance in Corner with Eyes Closed - 1 x daily - 5 x weekly - 1 sets - 10 reps

## 2021-07-21 ENCOUNTER — Other Ambulatory Visit: Payer: Self-pay | Admitting: Internal Medicine

## 2021-07-22 LAB — CBC
HCT: 42 % (ref 35.0–45.0)
Hemoglobin: 13 g/dL (ref 11.7–15.5)
MCH: 27.7 pg (ref 27.0–33.0)
MCHC: 31 g/dL — ABNORMAL LOW (ref 32.0–36.0)
MCV: 89.4 fL (ref 80.0–100.0)
MPV: 10.2 fL (ref 7.5–12.5)
Platelets: 264 10*3/uL (ref 140–400)
RBC: 4.7 10*6/uL (ref 3.80–5.10)
RDW: 12.9 % (ref 11.0–15.0)
WBC: 5.6 10*3/uL (ref 3.8–10.8)

## 2021-07-22 LAB — COMPLETE METABOLIC PANEL WITH GFR
AG Ratio: 1.4 (calc) (ref 1.0–2.5)
ALT: 26 U/L (ref 6–29)
AST: 27 U/L (ref 10–35)
Albumin: 4.3 g/dL (ref 3.6–5.1)
Alkaline phosphatase (APISO): 64 U/L (ref 37–153)
BUN: 14 mg/dL (ref 7–25)
CO2: 29 mmol/L (ref 20–32)
Calcium: 9.8 mg/dL (ref 8.6–10.4)
Chloride: 103 mmol/L (ref 98–110)
Creat: 0.75 mg/dL (ref 0.60–1.00)
Globulin: 3.1 g/dL (calc) (ref 1.9–3.7)
Glucose, Bld: 77 mg/dL (ref 65–99)
Potassium: 3.9 mmol/L (ref 3.5–5.3)
Sodium: 143 mmol/L (ref 135–146)
Total Bilirubin: 0.8 mg/dL (ref 0.2–1.2)
Total Protein: 7.4 g/dL (ref 6.1–8.1)
eGFR: 85 mL/min/{1.73_m2} (ref >=60)

## 2021-07-22 LAB — LIPID PANEL
Cholesterol: 213 mg/dL — ABNORMAL HIGH (ref <200)
HDL: 57 mg/dL (ref >=50)
LDL Cholesterol (Calc): 142 mg/dL (calc) — ABNORMAL HIGH
Non-HDL Cholesterol (Calc): 156 mg/dL (calc) — ABNORMAL HIGH (ref <130)
Total CHOL/HDL Ratio: 3.7 (calc) (ref <5.0)
Triglycerides: 47 mg/dL (ref <150)

## 2021-07-22 LAB — TSH: TSH: 0.71 mIU/L (ref 0.40–4.50)

## 2022-12-23 IMAGING — MG MM DIGITAL SCREENING BILAT W/ TOMO AND CAD
8 series · 8 of 24 positions shown · non-contrast
Comparison: Previous exam(s).

CLINICAL DATA: Screening.

EXAM:
DIGITAL SCREENING BILATERAL MAMMOGRAM WITH TOMOSYNTHESIS AND CAD
TECHNIQUE: Bilateral screening digital craniocaudal and mediolateral oblique
mammograms were obtained. Bilateral screening digital breast
tomosynthesis was performed. The images were evaluated with
computer-aided detection.

[R MLO synth-2D]
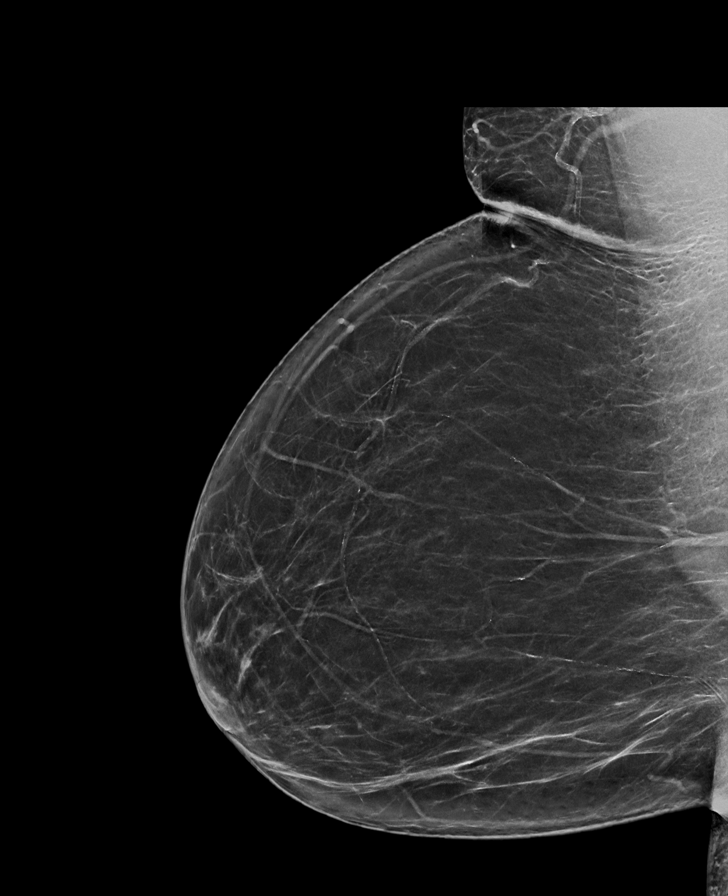

[R CC synth-2D]
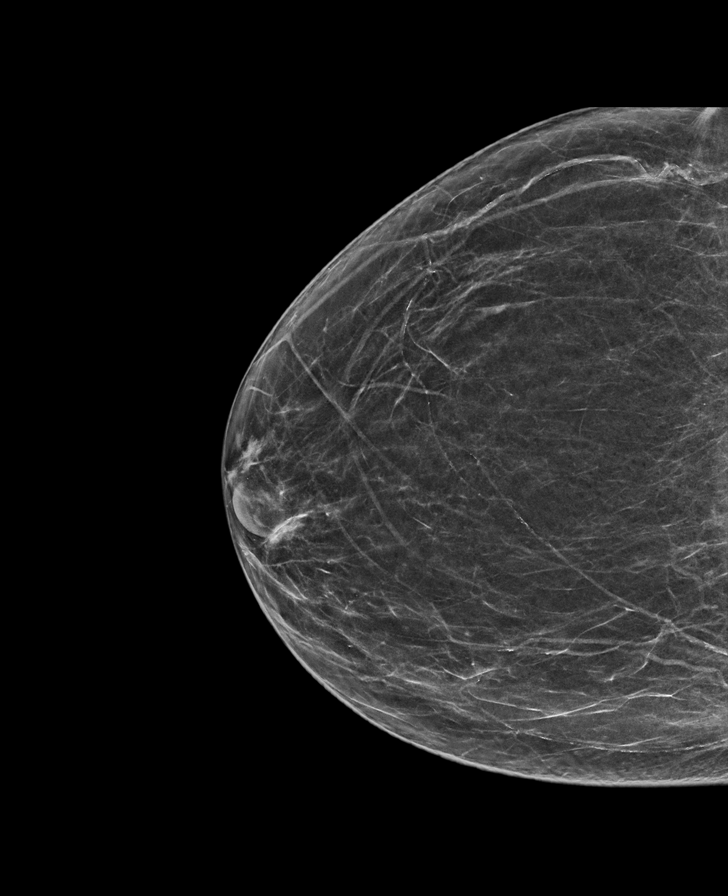

[L MLO synth-2D]
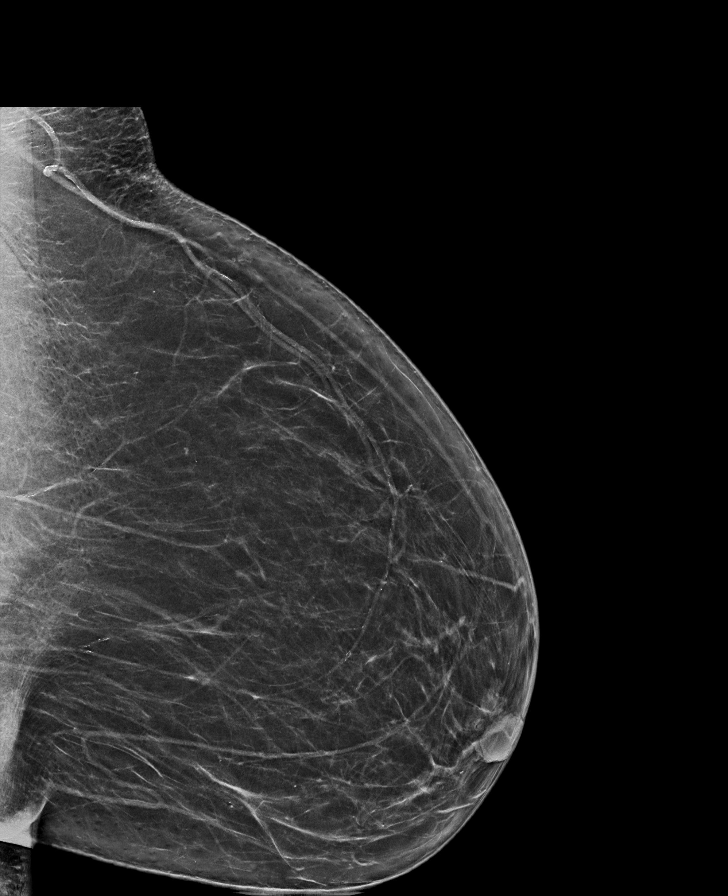

[L CC synth-2D]
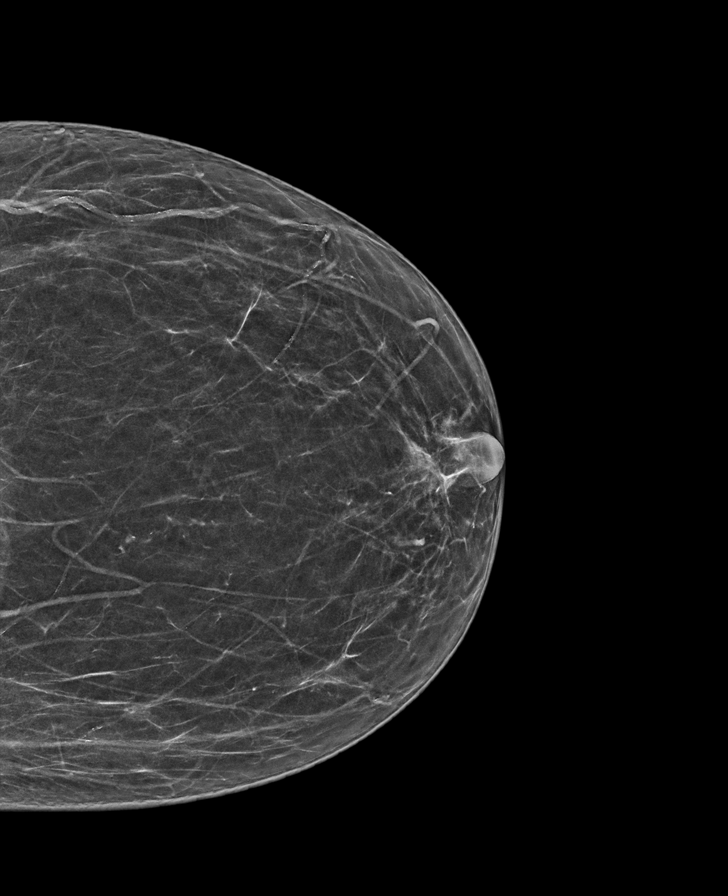

[L CC tomo · tomo slice 31/62.0]
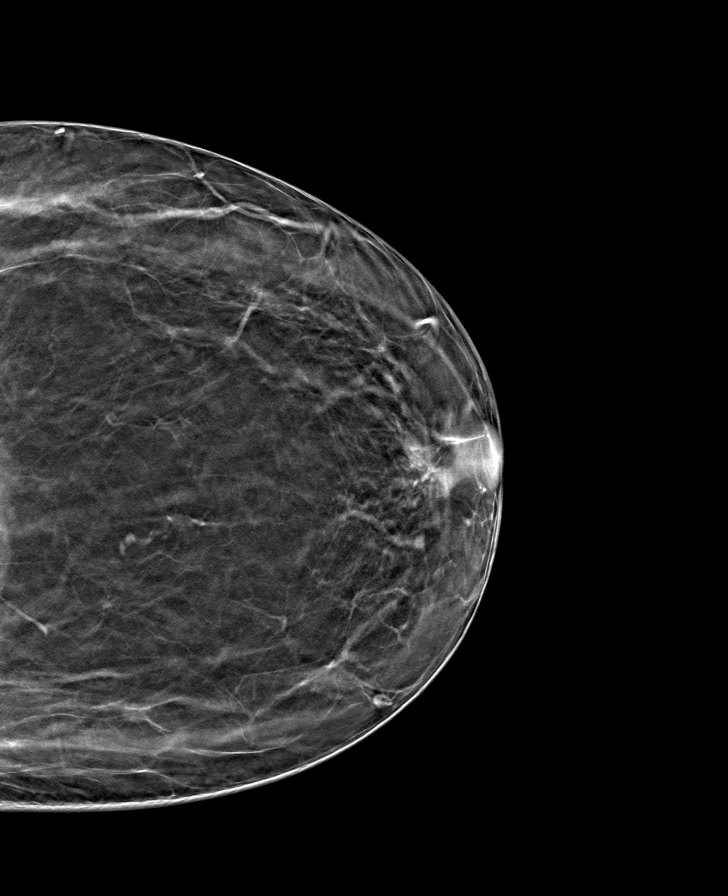

[R MLO tomo · tomo slice 37/72.0]
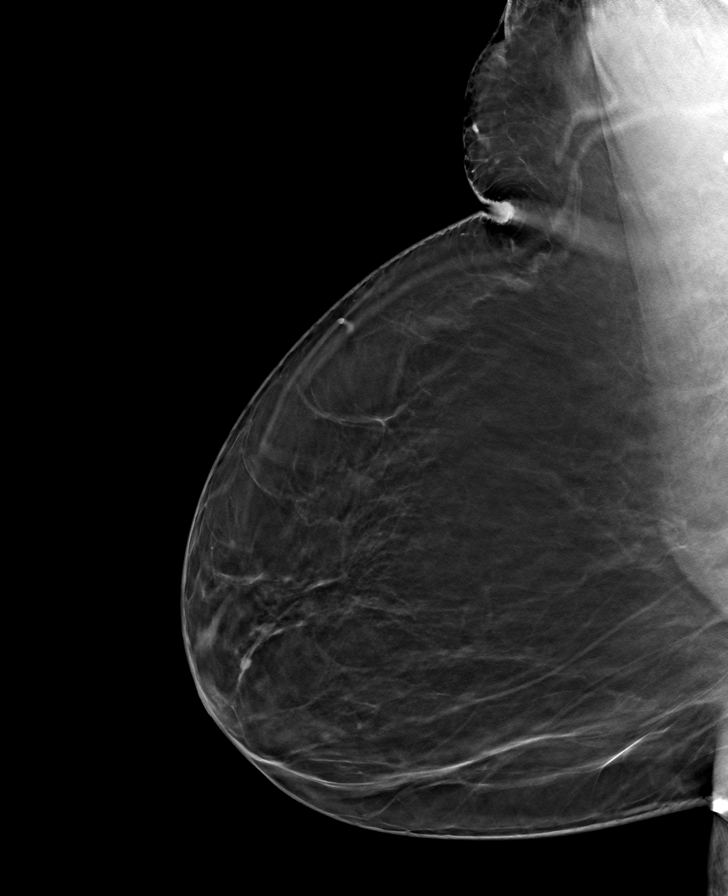

[L MLO tomo · tomo slice 37/73.0]
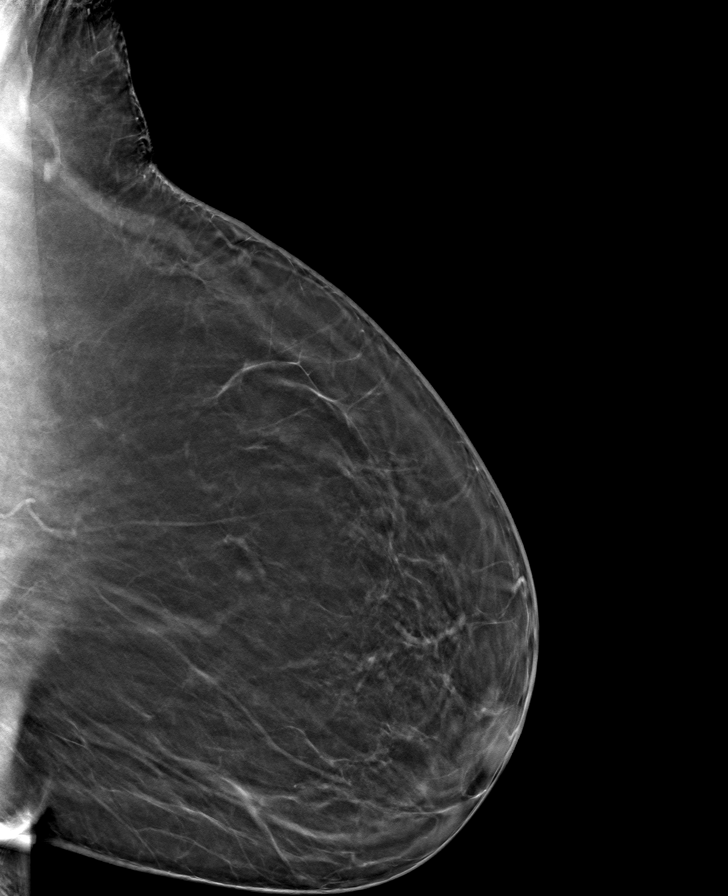

[R CC tomo · tomo slice 33/65.0]
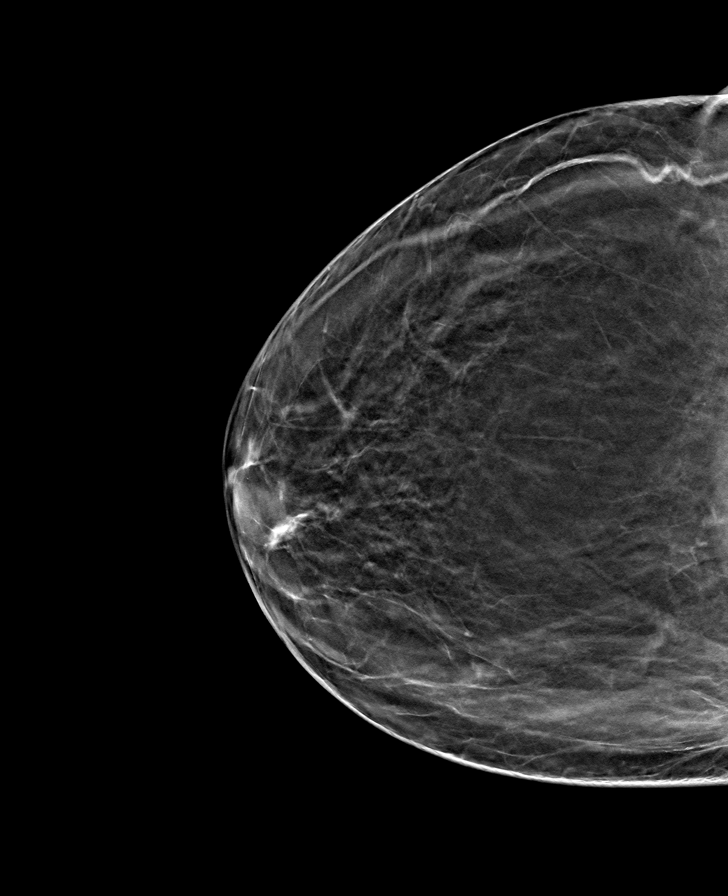

[8 of 24 positions shown; findings below may reference images not displayed]

ACR Breast Density Category b: There are scattered areas of
fibroglandular density.
FINDINGS: There are no findings suspicious for malignancy. The images were
evaluated with computer-aided detection.
IMPRESSION: No mammographic evidence of malignancy. A result letter of this
screening mammogram will be mailed directly to the patient.

RECOMMENDATION:
Screening mammogram in one year. (Code:WJ-I-BG6)

BI-RADS CATEGORY  1: Negative.
